# Patient Record
Sex: Female | Born: 1985 | Hispanic: Yes | Marital: Married | Smoking: Never smoker
Health system: Southern US, Community
[De-identification: ages and names within clinical notes are randomized; demographics above are authoritative.]

## PROBLEM LIST (undated history)

## (undated) DIAGNOSIS — F32A Depression, unspecified: Secondary | ICD-10-CM

## (undated) DIAGNOSIS — I959 Hypotension, unspecified: Secondary | ICD-10-CM

## (undated) DIAGNOSIS — N200 Calculus of kidney: Secondary | ICD-10-CM

## (undated) DIAGNOSIS — F419 Anxiety disorder, unspecified: Secondary | ICD-10-CM

## (undated) DIAGNOSIS — K8020 Calculus of gallbladder without cholecystitis without obstruction: Secondary | ICD-10-CM

## (undated) DIAGNOSIS — G43909 Migraine, unspecified, not intractable, without status migrainosus: Secondary | ICD-10-CM

## (undated) HISTORY — DX: Calculus of kidney: N20.0

## (undated) HISTORY — DX: Migraine, unspecified, not intractable, without status migrainosus: G43.909

---

## 2018-06-24 ENCOUNTER — Emergency Department (HOSPITAL_COMMUNITY)
Admission: EM | Admit: 2018-06-24 | Discharge: 2018-06-24 | Disposition: A | Discharge status: Home / Self Care | Payer: Self-pay | Attending: Emergency Medicine | Admitting: Emergency Medicine

## 2018-06-24 ENCOUNTER — Emergency Department (HOSPITAL_COMMUNITY): Payer: Self-pay

## 2018-06-24 ENCOUNTER — Encounter (HOSPITAL_COMMUNITY): Payer: Self-pay | Admitting: Emergency Medicine

## 2018-06-24 ENCOUNTER — Other Ambulatory Visit: Payer: Self-pay

## 2018-06-24 DIAGNOSIS — R102 Pelvic and perineal pain: Secondary | ICD-10-CM | POA: Insufficient documentation

## 2018-06-24 DIAGNOSIS — R1010 Upper abdominal pain, unspecified: Secondary | ICD-10-CM | POA: Insufficient documentation

## 2018-06-24 DIAGNOSIS — N201 Calculus of ureter: Secondary | ICD-10-CM | POA: Insufficient documentation

## 2018-06-24 HISTORY — DX: Calculus of gallbladder without cholecystitis without obstruction: K80.20

## 2018-06-24 LAB — CBC
HCT: 36.1 % (ref 36.0–46.0)
Hemoglobin: 12.2 g/dL (ref 12.0–15.0)
MCH: 31.7 pg (ref 26.0–34.0)
MCHC: 33.8 g/dL (ref 30.0–36.0)
MCV: 93.8 fL (ref 80.0–100.0)
Platelets: 183 10*3/uL (ref 150–400)
RBC: 3.85 MIL/uL — ABNORMAL LOW (ref 3.87–5.11)
RDW: 12 % (ref 11.5–15.5)
WBC: 5.7 10*3/uL (ref 4.0–10.5)
nRBC: 0 % (ref 0.0–0.2)

## 2018-06-24 LAB — URINALYSIS, ROUTINE W REFLEX MICROSCOPIC
Bilirubin Urine: NEGATIVE
Glucose, UA: NEGATIVE mg/dL
Ketones, ur: 5 mg/dL — AB
Leukocytes,Ua: NEGATIVE
Nitrite: NEGATIVE
Protein, ur: NEGATIVE mg/dL
RBC / HPF: >50 RBC/hpf — ABNORMAL HIGH (ref 0–5)
Specific Gravity, Urine: 1.008 (ref 1.005–1.030)
pH: 6 (ref 5.0–8.0)

## 2018-06-24 LAB — I-STAT BETA HCG BLOOD, ED (MC, WL, AP ONLY): I-stat hCG, quantitative: <5 m[IU]/mL (ref <5)

## 2018-06-24 LAB — COMPREHENSIVE METABOLIC PANEL
ALT: 24 U/L (ref 0–44)
AST: 23 U/L (ref 15–41)
Albumin: 3.9 g/dL (ref 3.5–5.0)
Alkaline Phosphatase: 70 U/L (ref 38–126)
Anion gap: 8 (ref 5–15)
BUN: 12 mg/dL (ref 6–20)
CO2: 21 mmol/L — ABNORMAL LOW (ref 22–32)
Calcium: 8.6 mg/dL — ABNORMAL LOW (ref 8.9–10.3)
Chloride: 107 mmol/L (ref 98–111)
Creatinine, Ser: 0.79 mg/dL (ref 0.44–1.00)
GFR calc Af Amer: >60 mL/min (ref >60)
GFR calc non Af Amer: >60 mL/min (ref >60)
Glucose, Bld: 90 mg/dL (ref 70–99)
Potassium: 4 mmol/L (ref 3.5–5.1)
Sodium: 136 mmol/L (ref 135–145)
Total Bilirubin: 0.6 mg/dL (ref 0.3–1.2)
Total Protein: 7.1 g/dL (ref 6.5–8.1)

## 2018-06-24 LAB — LIPASE, BLOOD: Lipase: 35 U/L (ref 11–51)

## 2018-06-24 IMAGING — CT CT RENAL STONE PROTOCOL
2 of 4 series · 16 of 46 positions shown, 18 images · non-contrast
Comparison: None.

CLINICAL DATA: Onset abdominal pain last night. Pain is not
centered in the right lower quadrant and is worsened.

EXAM:
CT ABDOMEN AND PELVIS WITHOUT CONTRAST
TECHNIQUE: Multidetector CT imaging of the abdomen and pelvis was performed
following the standard protocol without IV contrast.

[Series 2: axial st · axial · 0.76mm/px · z∈[-501,-116]mm · 13 of 89 slices shown, 15 images]
[im 6/89  soft-tissue]
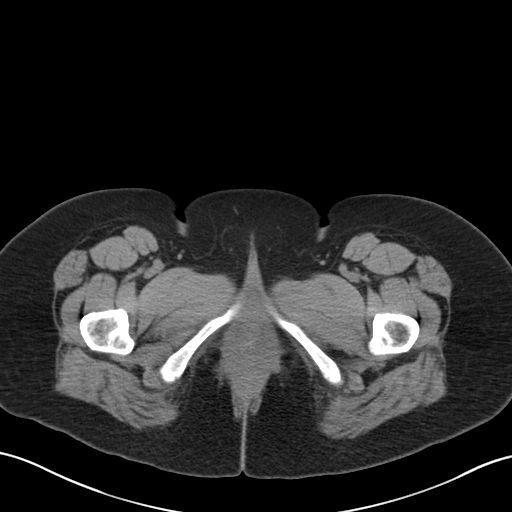
[im 6/89  bone]
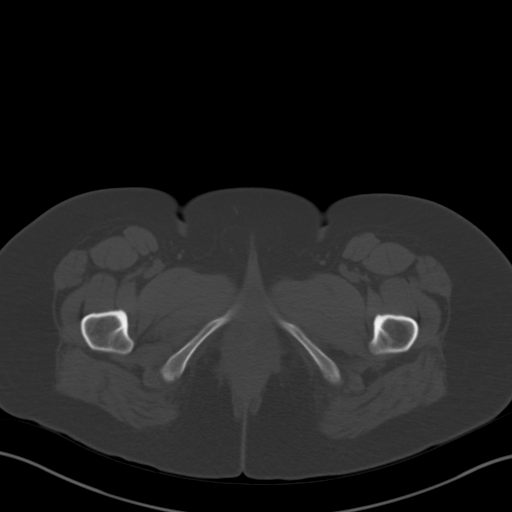
[im 12/89  soft-tissue]
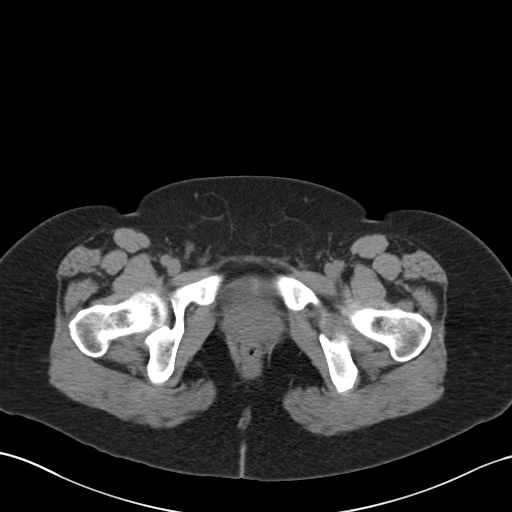
[im 17/89  soft-tissue]
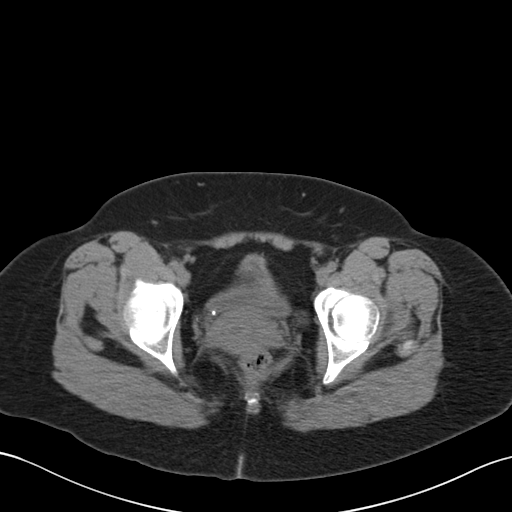
[im 28/89  soft-tissue]
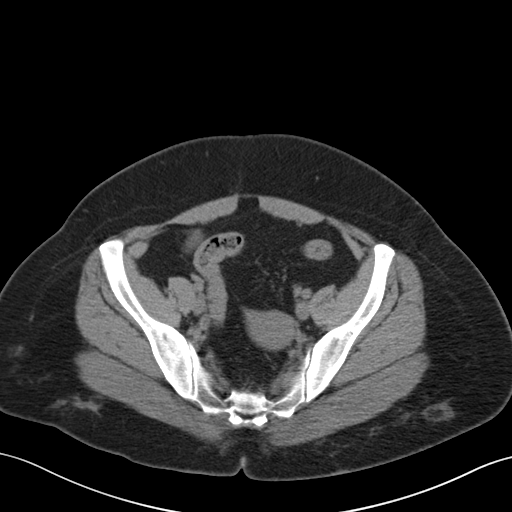
[im 34/89  soft-tissue]
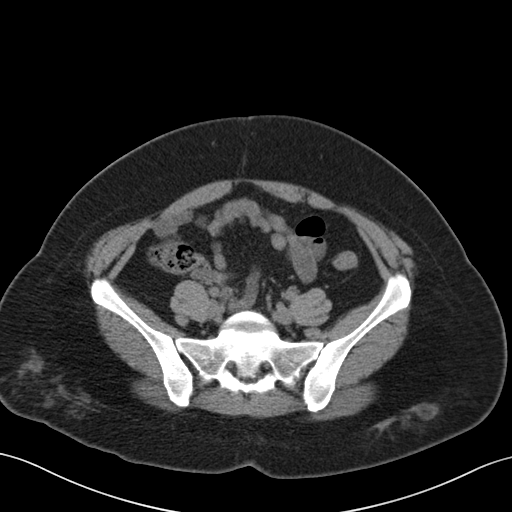
[im 39/89  soft-tissue]
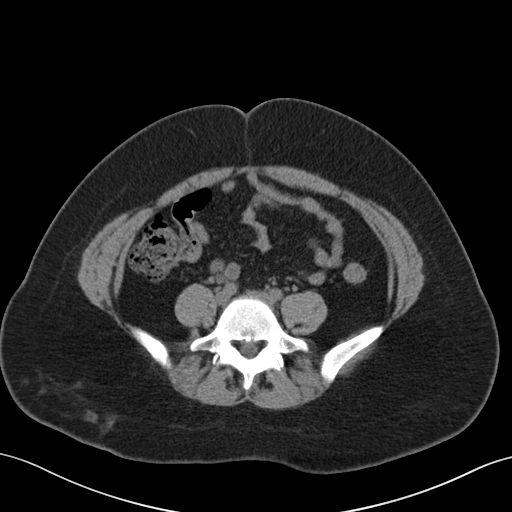
[im 45/89  soft-tissue]
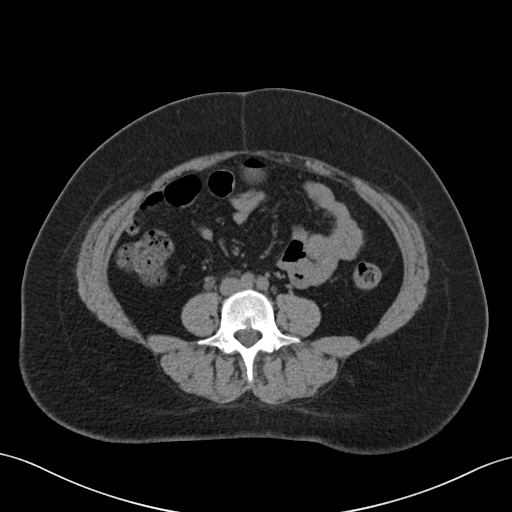
[im 50/89  soft-tissue]
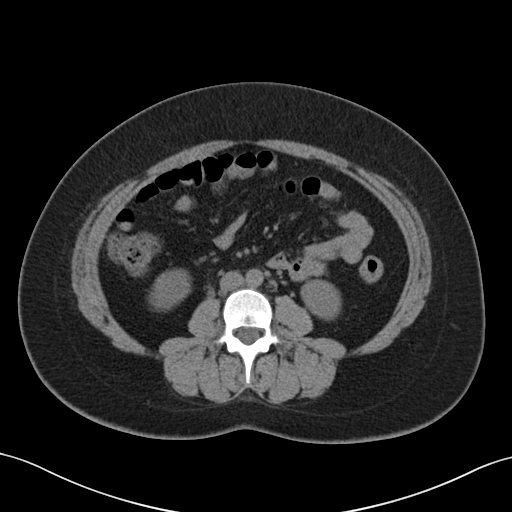
[im 56/89  soft-tissue]
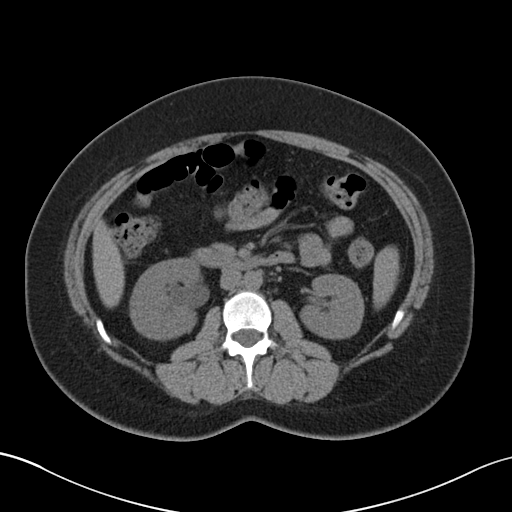
[im 56/89  bone]
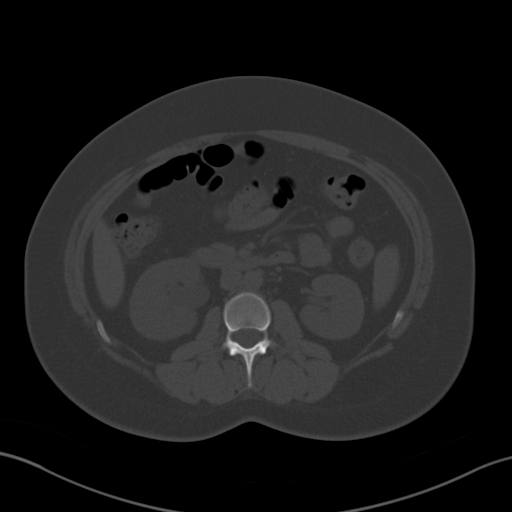
[im 61/89  soft-tissue]
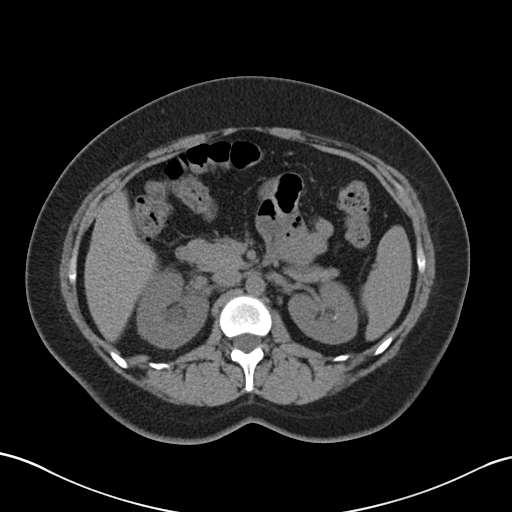
[im 72/89  soft-tissue]
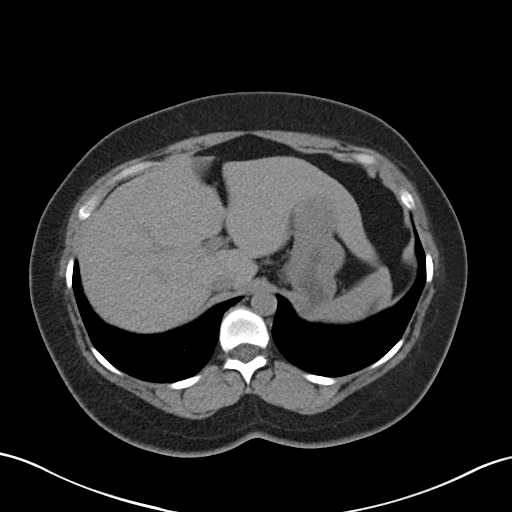
[im 78/89  soft-tissue]
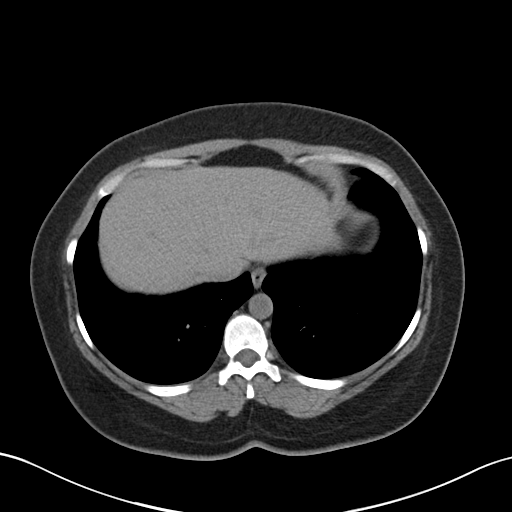
[im 83/89  soft-tissue]
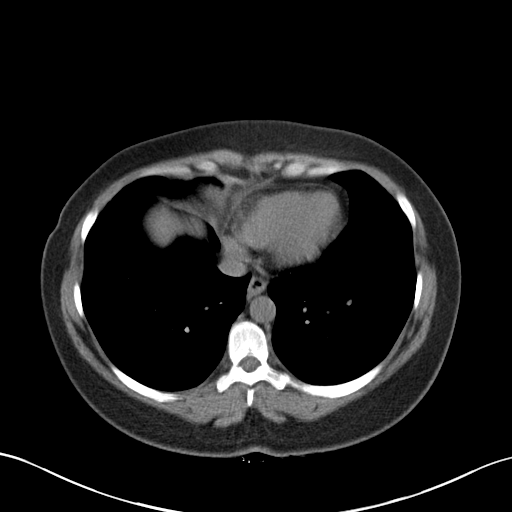

[Series 4: coronal · coronal · 0.70mm/px · 3 of 142 slices shown]
[im 48/142  soft-tissue]
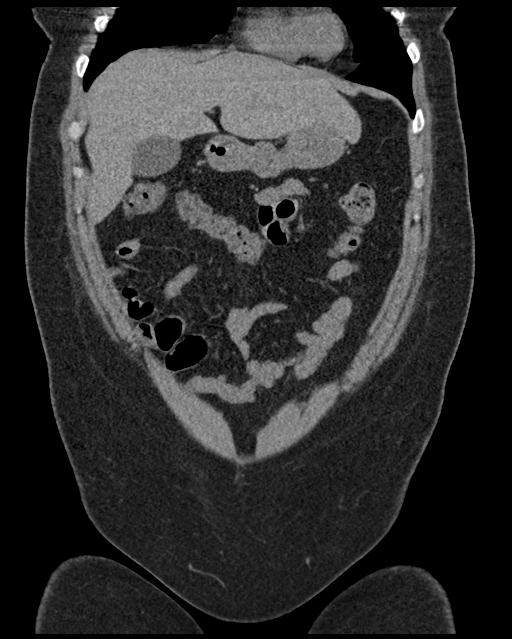
[im 63/142  soft-tissue]
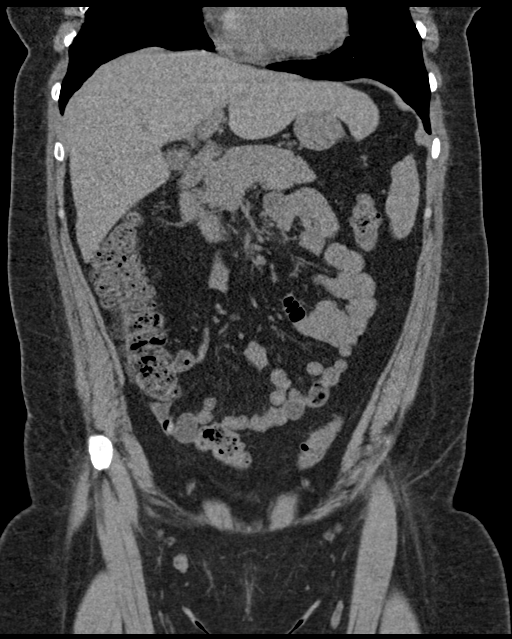
[im 79/142  soft-tissue]
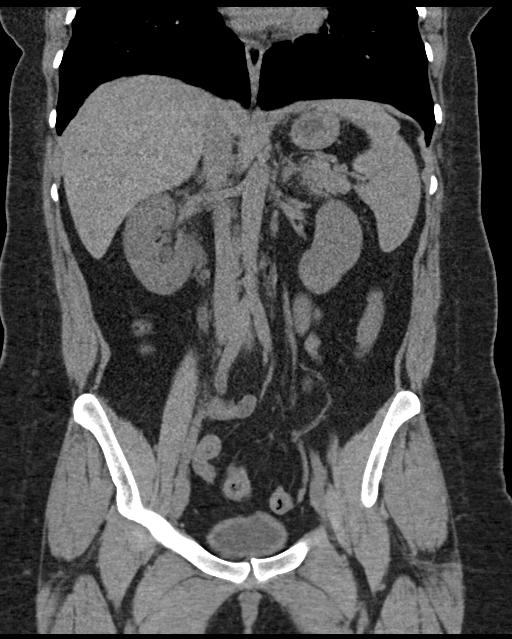

[16 of 46 positions shown; findings below may reference images not displayed]

FINDINGS: Lower chest: Lung bases are clear. No pleural or pericardial
effusion. Heart size is normal.

Hepatobiliary: No focal liver abnormality is seen. No gallstones,
gallbladder wall thickening, or biliary dilatation.

Pancreas: Unremarkable. No pancreatic ductal dilatation or
surrounding inflammatory changes.

Spleen: Normal in size without focal abnormality.

Adrenals/Urinary Tract: The patient has mild to moderate right
hydronephrosis with some stranding about the right kidney and ureter
due to a 0.3 cm stone at the right UVJ. 0.3 cm nonobstructing stone
upper pole left kidney is identified. The kidneys are otherwise
normal appearance. Urinary bladder appears normal. The adrenal
glands are normal.

Stomach/Bowel: Stomach is within normal limits. Appendix appears
normal. No evidence of bowel wall thickening, distention, or
inflammatory changes.

Vascular/Lymphatic: No significant vascular findings are present. No
enlarged abdominal or pelvic lymph nodes.

Reproductive: Uterus and bilateral adnexa are unremarkable.

Other: None.

Musculoskeletal: Negative.
IMPRESSION: Mild to moderate right hydronephrosis due to a 0.3 cm stone at the
right UVJ.

0.3 cm nonobstructing stone left kidney.

## 2018-06-24 IMAGING — US ULTRASOUND ABDOMEN LIMITED
1 series · 14 of 25 positions shown · non-contrast
Comparison: None.

CLINICAL DATA: Upper abdominal pain and nausea.

EXAM:
ULTRASOUND ABDOMEN LIMITED RIGHT UPPER QUADRANT

[Series 1: ultrasound abdomen limited · 14 of 33 slices shown]
[im 1/33]
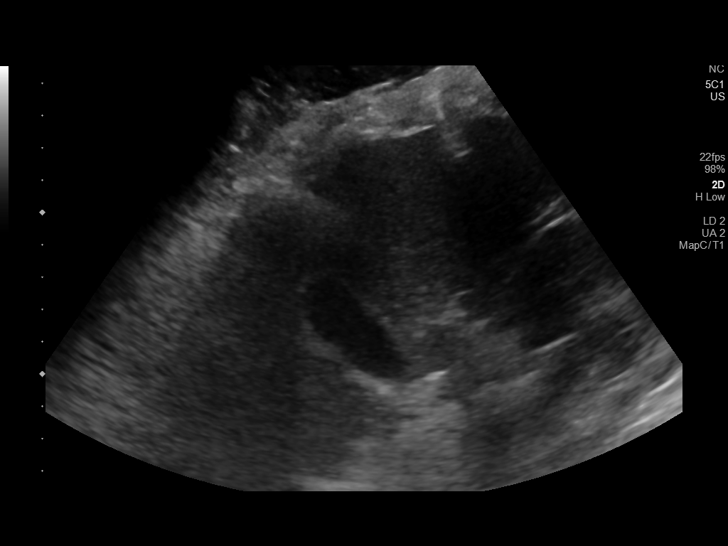
[im 3/33]
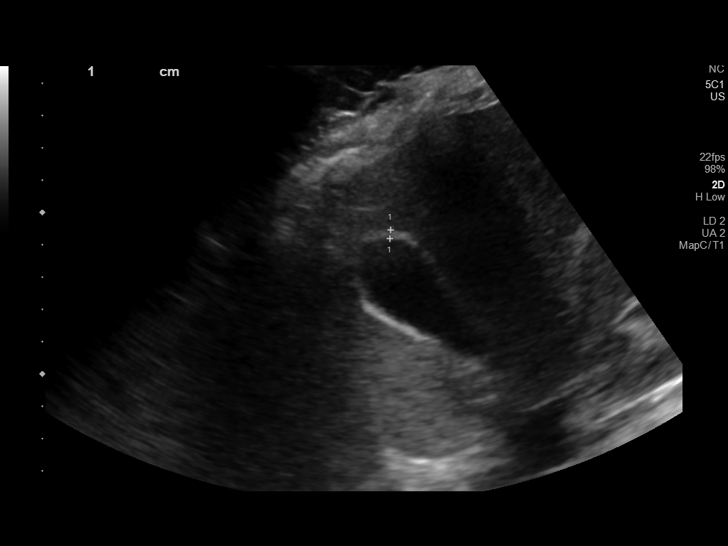
[im 6/33]
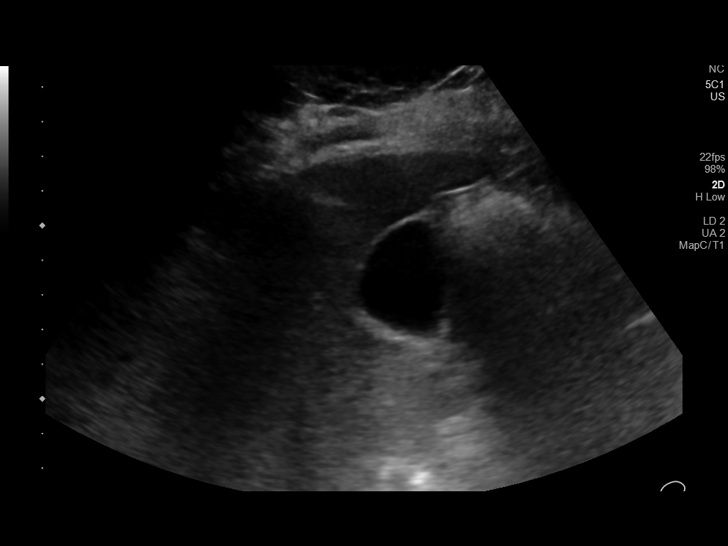
[im 9/33]
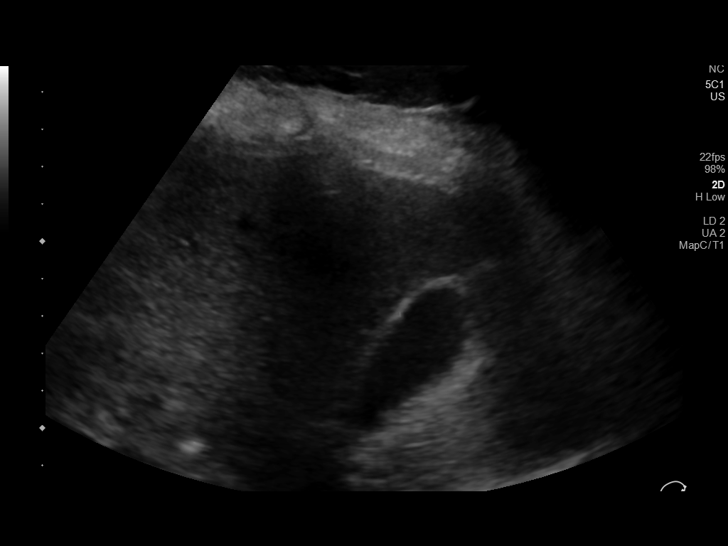
[im 11/33]
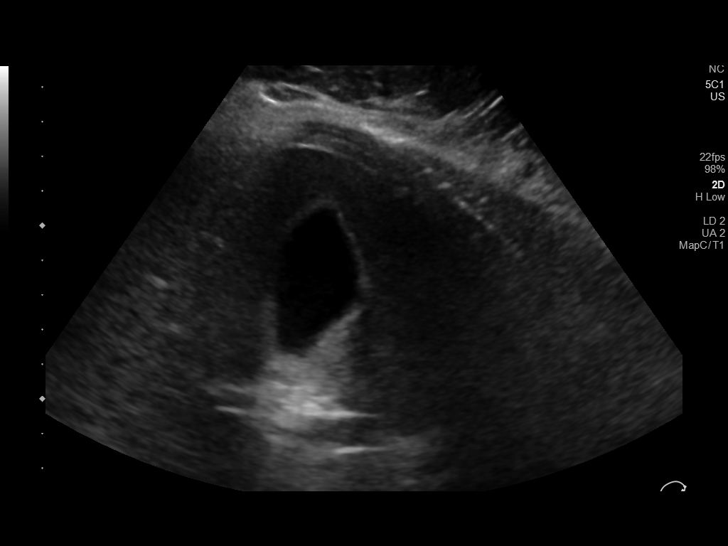
[im 13/33]
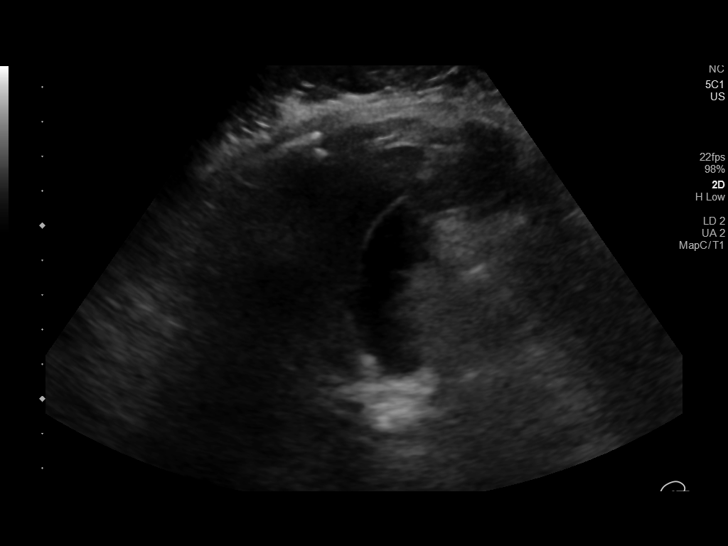
[im 15/33]
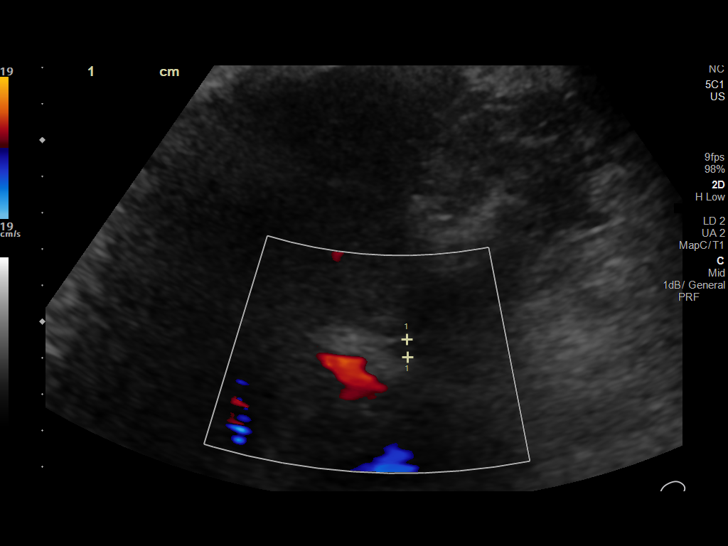
[im 18/33]
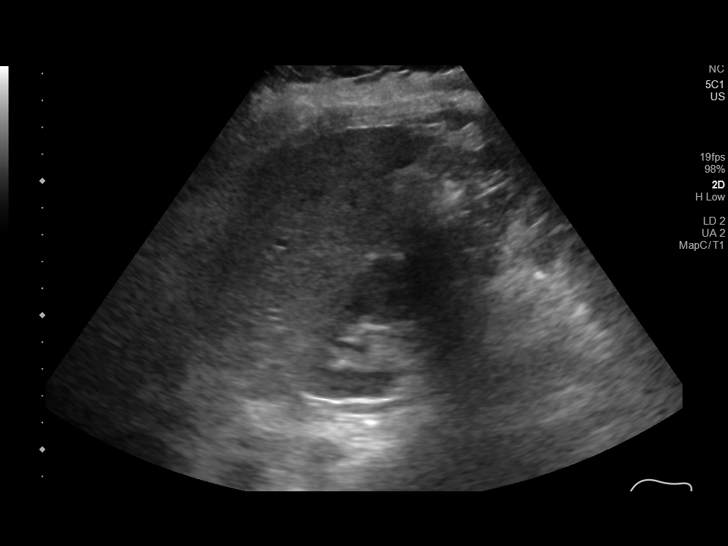
[im 21/33]
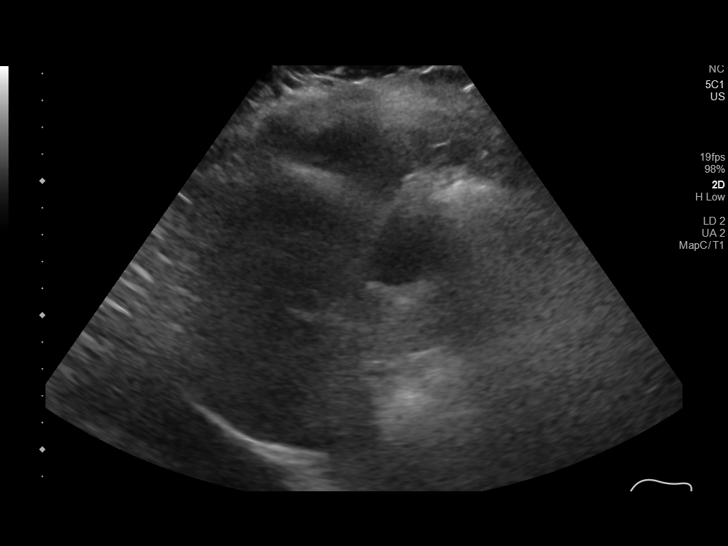
[im 22/33]
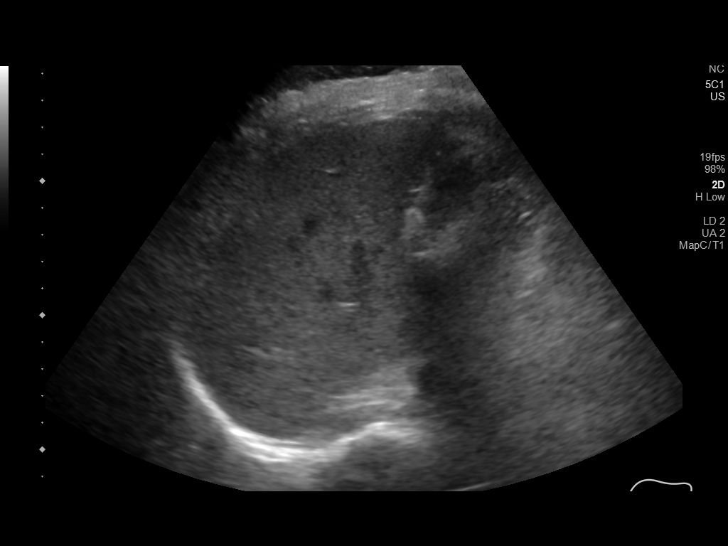
[im 25/33]
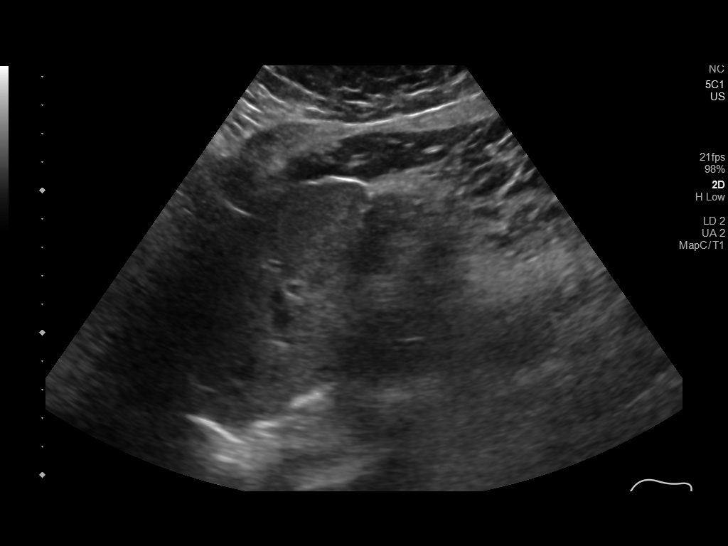
[im 27/33]
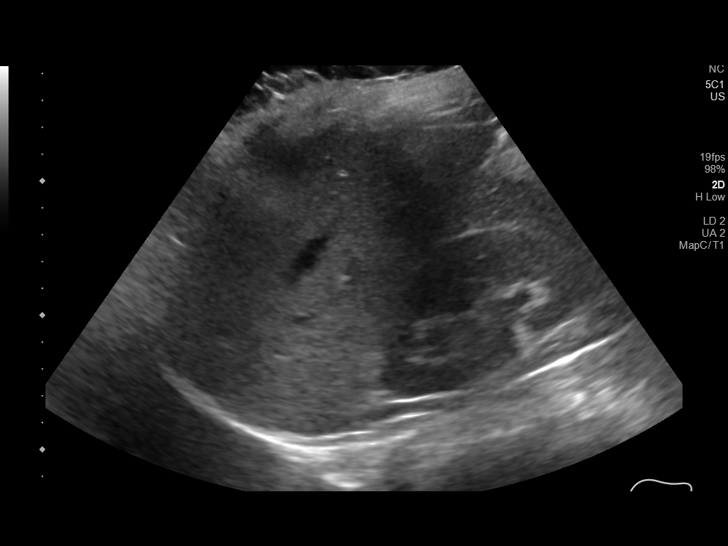
[im 30/33]
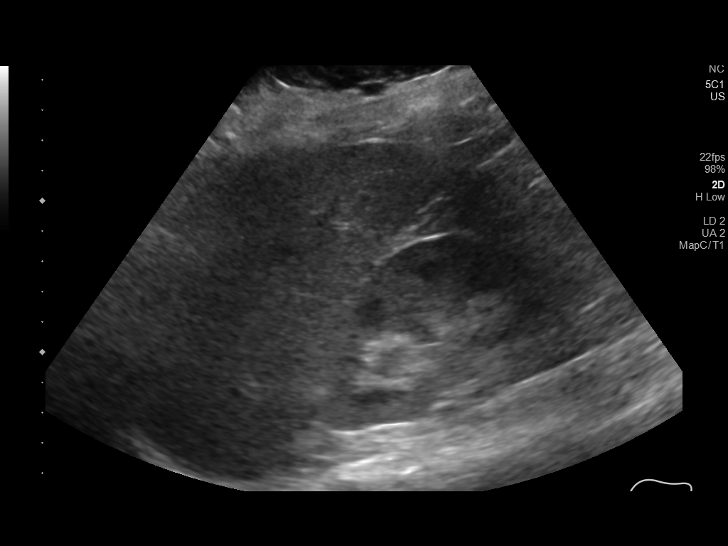
[im 33/33]
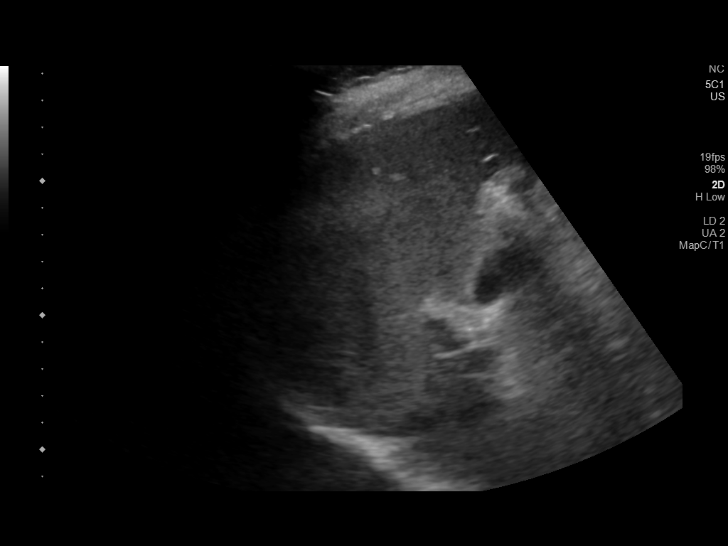

[14 of 25 positions shown; findings below may reference images not displayed]

FINDINGS: Gallbladder:

No gallstones or wall thickening visualized. No sonographic Murphy
sign noted by sonographer.

Common bile duct:

Diameter: 4.9 mm, normal.

Liver:

No focal lesion identified. Within normal limits in parenchymal
echogenicity. Portal vein is patent on color Doppler imaging with
normal direction of blood flow towards the liver.
IMPRESSION: Normal exam.

## 2018-06-24 MED ORDER — IBUPROFEN 600 MG PO TABS: 600.0000 mg | ORAL_TABLET | Freq: Three times a day (TID) | ORAL | 0 refills | Status: DC | PRN

## 2018-06-24 MED ORDER — SODIUM CHLORIDE 0.9 % IV BOLUS
1000.0000 mL | Freq: Once | INTRAVENOUS | Status: AC
  Administered 2018-06-24: 1000 mL via INTRAVENOUS

## 2018-06-24 MED ORDER — TAMSULOSIN HCL 0.4 MG PO CAPS: 0.4000 mg | ORAL_CAPSULE | Freq: Every day | ORAL | 0 refills | Status: DC

## 2018-06-24 MED ORDER — ONDANSETRON HCL 4 MG/2ML IJ SOLN
4.0000 mg | Freq: Once | INTRAMUSCULAR | Status: AC
  Administered 2018-06-24: 08:00:00 4 mg via INTRAVENOUS
  Filled 2018-06-24: qty 2

## 2018-06-24 MED ORDER — OXYCODONE-ACETAMINOPHEN 5-325 MG PO TABS: 1.0000 | ORAL_TABLET | ORAL | 0 refills | Status: DC | PRN

## 2018-06-24 MED ORDER — MORPHINE SULFATE (PF) 4 MG/ML IV SOLN
4.0000 mg | Freq: Once | INTRAVENOUS | Status: AC
  Administered 2018-06-24: 08:00:00 4 mg via INTRAVENOUS
  Filled 2018-06-24: qty 1

## 2018-06-24 MED ORDER — KETOROLAC TROMETHAMINE 30 MG/ML IJ SOLN
30.0000 mg | Freq: Once | INTRAMUSCULAR | Status: AC
  Administered 2018-06-24: 11:00:00 30 mg via INTRAVENOUS
  Filled 2018-06-24: qty 1

## 2018-06-24 MED ORDER — PROMETHAZINE HCL 25 MG PO TABS: 25.0000 mg | ORAL_TABLET | Freq: Four times a day (QID) | ORAL | 0 refills | Status: DC | PRN

## 2018-06-24 MED ORDER — MORPHINE SULFATE (PF) 4 MG/ML IV SOLN
4.0000 mg | Freq: Once | INTRAVENOUS | Status: AC
  Administered 2018-06-24: 4 mg via INTRAVENOUS
  Filled 2018-06-24: qty 1

## 2018-06-24 MED ORDER — OXYCODONE-ACETAMINOPHEN 5-325 MG PO TABS
2.0000 | ORAL_TABLET | Freq: Once | ORAL | Status: AC
  Administered 2018-06-24: 12:00:00 2 via ORAL
  Filled 2018-06-24: qty 2

## 2018-06-24 NOTE — ED Provider Notes (Signed)
[facility]-EMERGENCY DEPT Provider Note   CSN: [id number] Arrival date & time: 06/24/18  4098    History   Chief Complaint Chief Complaint  Patient presents with  . Abdominal Pain   Interpretor services utilized HPI Kimberly Poole is a 33 y.o. female.     HPI 33 year old female presents the emergency department with severe right upper quadrant abdominal pain that began last night.  It is been worsening this morning.  She reports nausea without vomiting or diarrhea.  She denies fevers or chills.  No discomfort with urination.  She reports a history of gallstones.  No recent sick contacts.  No fevers.  Pain is moderate to severe in severity.   Past Medical History:  Diagnosis Date  . Gallstones     There are no active problems to display for this patient.   History reviewed. No pertinent surgical history.   OB History   No obstetric history on file.      Home Medications    Prior to Admission medications   Not on File    Family History History reviewed. No pertinent family history.  Social History Social History   Tobacco Use  . Smoking status: Not on file  Substance Use Topics  . Alcohol use: Not on file  . Drug use: Not on file     Allergies   Patient has no allergy information on record.   Review of Systems Review of Systems  All other systems reviewed and are negative.    Physical Exam Updated Vital Signs BP 115/62 (BP Location: Right Arm)   Pulse 93   Temp 97.8 F (36.6 C) (Oral)   Resp 20   Ht  (1.626 m)   Wt 75 kg   LMP  (LMP Unknown)   SpO2 100%   BMI 28.38 kg/m   Physical Exam Vitals signs and nursing note reviewed.  Constitutional:      General: She is not in acute distress.    Appearance: She is well-developed.  HENT:     Head: Normocephalic and atraumatic.  Neck:     Musculoskeletal: Normal range of motion.  Cardiovascular:     Rate and Rhythm: Normal rate and regular rhythm.     Heart  sounds: Normal heart sounds.  Pulmonary:     Effort: Pulmonary effort is normal.     Breath sounds: Normal breath sounds.  Abdominal:     General: There is no distension.     Palpations: Abdomen is soft.     Tenderness: There is abdominal tenderness in the right upper quadrant and epigastric area.  Musculoskeletal: Normal range of motion.  Skin:    General: Skin is warm and dry.  Neurological:     Mental Status: She is alert and oriented to person, place, and time.  Psychiatric:        Judgment: Judgment normal.      ED Treatments / Results  Labs (all labs ordered are listed, but only abnormal results are displayed) Labs Reviewed  CBC - Abnormal; Notable for the following components:      Result Value   RBC 3.85 (*)    All other components within normal limits  COMPREHENSIVE METABOLIC PANEL - Abnormal; Notable for the following components:   CO2 21 (*)    Calcium 8.6 (*)    All other components within normal limits  URINALYSIS, ROUTINE W REFLEX MICROSCOPIC - Abnormal; Notable for the following components:   APPearance HAZY (*)  Hgb urine dipstick LARGE (*)    Ketones, ur 5 (*)    RBC / HPF >50 (*)    Bacteria, UA MANY (*)    All other components within normal limits  LIPASE, BLOOD  I-STAT BETA HCG BLOOD, ED ([facility], [facility], [facility] ONLY)    EKG None  Radiology Ct Renal Stone Study  Result Date: 06/24/2018 CLINICAL DATA:  Onset abdominal pain last night. Pain is not centered in the right lower quadrant and is worsened. EXAM: CT ABDOMEN AND PELVIS WITHOUT CONTRAST TECHNIQUE: Multidetector CT imaging of the abdomen and pelvis was performed following the standard protocol without IV contrast. COMPARISON:  None. FINDINGS: Lower chest: Lung bases are clear. No pleural or pericardial effusion. Heart size is normal. Hepatobiliary: No focal liver abnormality is seen. No gallstones, gallbladder wall thickening, or biliary dilatation. Pancreas: Unremarkable. No pancreatic ductal dilatation  or surrounding inflammatory changes. Spleen: Normal in size without focal abnormality. Adrenals/Urinary Tract: The patient has mild to moderate right hydronephrosis with some stranding about the right kidney and ureter due to a 0.3 cm stone at the right UVJ. 0.3 cm nonobstructing stone upper pole left kidney is identified. The kidneys are otherwise normal appearance. Urinary bladder appears normal. The adrenal glands are normal. Stomach/Bowel: Stomach is within normal limits. Appendix appears normal. No evidence of bowel wall thickening, distention, or inflammatory changes. Vascular/Lymphatic: No significant vascular findings are present. No enlarged abdominal or pelvic lymph nodes. Reproductive: Uterus and bilateral adnexa are unremarkable. Other: None. Musculoskeletal: Negative. IMPRESSION: Mild to moderate right hydronephrosis due to a 0.3 cm stone at the right UVJ. 0.3 cm nonobstructing stone left kidney. Electronically Signed   By: Drusilla Kanner M.D.   On: 06/24/2018 10:42   US Abdomen Limited Ruq  Result Date: 06/24/2018 CLINICAL DATA:  Upper abdominal pain and nausea. EXAM: ULTRASOUND ABDOMEN LIMITED RIGHT UPPER QUADRANT COMPARISON:  None. FINDINGS: Gallbladder: No gallstones or wall thickening visualized. No sonographic Murphy sign noted by sonographer. Common bile duct: Diameter: 4.9 mm, normal. Liver: No focal lesion identified. Within normal limits in parenchymal echogenicity. Portal vein is patent on color Doppler imaging with normal direction of blood flow towards the liver. IMPRESSION: Normal exam. Electronically Signed   By: Francene Boyers M.D.   On: 06/24/2018 08:38    Procedures Procedures (including critical care time)  Medications Ordered in ED Medications  oxyCODONE-acetaminophen (PERCOCET/ROXICET) 5-325 MG per tablet 2 tablet (has no administration in time range)  morphine 4 MG/ML injection 4 mg (4 mg Intravenous Given 06/24/18 0801)  ondansetron (ZOFRAN) injection 4 mg (4 mg  Intravenous Given 06/24/18 0801)  sodium chloride 0.9 % bolus 1,000 mL (0 mLs Intravenous Stopped 06/24/18 0922)  morphine 4 MG/ML injection 4 mg (4 mg Intravenous Given 06/24/18 0814)  ketorolac (TORADOL) 30 MG/ML injection 30 mg (30 mg Intravenous Given 06/24/18 1104)     Initial Impression / Assessment and Plan / ED Course  I have reviewed the triage vital signs and the nursing notes.  Pertinent labs & imaging results that were available during my care of the patient were reviewed by me and considered in my medical decision making (see chart for details).        Right distal ureteral stone. Standard precautions given. Pain significantly improved in the ER. Discharge home with standard medications. Outpatient urology follow up. No signs of UTI.     Final Clinical Impressions(s) / ED Diagnoses   Final diagnoses:  Upper abdominal pain    ED Discharge Orders  None       Azalia Bilis, MD 06/24/18 1134

## 2018-06-24 NOTE — ED Notes (Signed)
Bed: OZ30 Expected date:  Expected time:  Means of arrival:  Comments: 33 yo F/ abd pain

## 2018-06-24 NOTE — ED Notes (Signed)
Patient verbalized understanding using Wali translation device. Patient was taken out of ED w/ wheelchair by ED staff.

## 2018-06-24 NOTE — ED Notes (Addendum)
[place] is at bedside doing ABD ultrasound.

## 2018-06-24 NOTE — ED Triage Notes (Signed)
Patient arrived by EMS from home. Pt c/o ABD pain that started last night. Pt woke up 0700 w/ unbearable pain on the lower right quadrant that radiates towards the back per EMS. EMS reported no V/D. Pt has a little nausea.   Pt only speaks [place]. PT is new to area. PT came from [place] about two months ago per EMS.   Hx of gallstones.   EMS VS BP 118/86, HR 78, RR 18, SpO2 99% on RA, T 97.5 F.

## 2018-06-24 NOTE — ED Notes (Signed)
Patient made aware of need for urine sample. Patient stated they are unable to provide at this time.  

## 2018-06-24 NOTE — ED Notes (Addendum)
PT aware of urine sample needed. PT unable to provide at this time.

## 2018-06-24 NOTE — ED Notes (Signed)
Patient states she has pain in RLQ.

## 2018-06-24 NOTE — ED Notes (Addendum)
Wali translation device was used for triage questions and assessment questions. Patient speaks Spanish.

## 2018-07-13 ENCOUNTER — Emergency Department (HOSPITAL_COMMUNITY)
Admission: EM | Admit: 2018-07-13 | Discharge: 2018-07-13 | Disposition: A | Discharge status: Home / Self Care | Payer: Self-pay | Attending: Emergency Medicine | Admitting: Emergency Medicine

## 2018-07-13 ENCOUNTER — Other Ambulatory Visit: Payer: Self-pay

## 2018-07-13 DIAGNOSIS — N23 Unspecified renal colic: Secondary | ICD-10-CM | POA: Insufficient documentation

## 2018-07-13 LAB — URINALYSIS, ROUTINE W REFLEX MICROSCOPIC
Bilirubin Urine: NEGATIVE
Glucose, UA: NEGATIVE mg/dL
Ketones, ur: 5 mg/dL — AB
Leukocytes,Ua: NEGATIVE
Nitrite: NEGATIVE
Protein, ur: NEGATIVE mg/dL
Specific Gravity, Urine: 1.006 (ref 1.005–1.030)
pH: 7 (ref 5.0–8.0)

## 2018-07-13 LAB — CBC WITH DIFFERENTIAL/PLATELET
Abs Immature Granulocytes: 0.03 10*3/uL (ref 0.00–0.07)
Basophils Absolute: 0 10*3/uL (ref 0.0–0.1)
Basophils Relative: 1 %
Eosinophils Absolute: 0 10*3/uL (ref 0.0–0.5)
Eosinophils Relative: 0 %
HCT: 37.8 % (ref 36.0–46.0)
Hemoglobin: 12.6 g/dL (ref 12.0–15.0)
Immature Granulocytes: 1 %
Lymphocytes Relative: 22 %
Lymphs Abs: 1.4 10*3/uL (ref 0.7–4.0)
MCH: 31.3 pg (ref 26.0–34.0)
MCHC: 33.3 g/dL (ref 30.0–36.0)
MCV: 94 fL (ref 80.0–100.0)
Monocytes Absolute: 0.6 10*3/uL (ref 0.1–1.0)
Monocytes Relative: 9 %
Neutro Abs: 4.5 10*3/uL (ref 1.7–7.7)
Neutrophils Relative %: 67 %
Platelets: 216 10*3/uL (ref 150–400)
RBC: 4.02 MIL/uL (ref 3.87–5.11)
RDW: 11.9 % (ref 11.5–15.5)
WBC: 6.6 10*3/uL (ref 4.0–10.5)
nRBC: 0 % (ref 0.0–0.2)

## 2018-07-13 LAB — POC URINE PREG, ED: Preg Test, Ur: NEGATIVE

## 2018-07-13 LAB — BASIC METABOLIC PANEL
Anion gap: 9 (ref 5–15)
BUN: 16 mg/dL (ref 6–20)
CO2: 20 mmol/L — ABNORMAL LOW (ref 22–32)
Calcium: 8.8 mg/dL — ABNORMAL LOW (ref 8.9–10.3)
Chloride: 110 mmol/L (ref 98–111)
Creatinine, Ser: 0.86 mg/dL (ref 0.44–1.00)
GFR calc Af Amer: >60 mL/min (ref >60)
GFR calc non Af Amer: >60 mL/min (ref >60)
Glucose, Bld: 112 mg/dL — ABNORMAL HIGH (ref 70–99)
Potassium: 3.4 mmol/L — ABNORMAL LOW (ref 3.5–5.1)
Sodium: 139 mmol/L (ref 135–145)

## 2018-07-13 MED ORDER — HYDROMORPHONE HCL 1 MG/ML IJ SOLN
1.0000 mg | Freq: Once | INTRAMUSCULAR | Status: AC
  Administered 2018-07-13: 02:00:00 1 mg via INTRAVENOUS
  Filled 2018-07-13: qty 1

## 2018-07-13 MED ORDER — LACTATED RINGERS IV BOLUS
1000.0000 mL | Freq: Once | INTRAVENOUS | Status: AC
  Administered 2018-07-13: 02:00:00 1000 mL via INTRAVENOUS

## 2018-07-13 MED ORDER — PROMETHAZINE HCL 25 MG PO TABS: 25.0000 mg | ORAL_TABLET | Freq: Four times a day (QID) | ORAL | 0 refills | Status: DC | PRN

## 2018-07-13 MED ORDER — TAMSULOSIN HCL 0.4 MG PO CAPS: 0.4000 mg | ORAL_CAPSULE | Freq: Every day | ORAL | 0 refills | Status: DC

## 2018-07-13 MED ORDER — ONDANSETRON HCL 4 MG/2ML IJ SOLN
4.0000 mg | Freq: Once | INTRAMUSCULAR | Status: AC
  Administered 2018-07-13: 02:00:00 4 mg via INTRAVENOUS
  Filled 2018-07-13: qty 2

## 2018-07-13 MED ORDER — TAMSULOSIN HCL 0.4 MG PO CAPS
0.4000 mg | ORAL_CAPSULE | Freq: Once | ORAL | Status: AC
  Administered 2018-07-13: 0.4 mg via ORAL
  Filled 2018-07-13: qty 1

## 2018-07-13 MED ORDER — IBUPROFEN 200 MG PO TABS
400.0000 mg | ORAL_TABLET | Freq: Once | ORAL | Status: AC
  Administered 2018-07-13: 05:00:00 400 mg via ORAL
  Filled 2018-07-13: qty 2

## 2018-07-13 MED ORDER — DIPHENHYDRAMINE HCL 50 MG/ML IJ SOLN
INTRAMUSCULAR | Status: AC
  Administered 2018-07-13: 02:00:00 25 mg
  Filled 2018-07-13: qty 1

## 2018-07-13 MED ORDER — OXYCODONE-ACETAMINOPHEN 5-325 MG PO TABS: 1.0000 | ORAL_TABLET | ORAL | 0 refills | Status: DC | PRN

## 2018-07-13 MED ORDER — IBUPROFEN 600 MG PO TABS: 600.0000 mg | ORAL_TABLET | Freq: Three times a day (TID) | ORAL | 0 refills | Status: DC | PRN

## 2018-07-13 NOTE — ED Provider Notes (Signed)
Emergency Department Provider Note   I have reviewed the triage vital signs and the nursing notes.   HISTORY  Chief Complaint Flank Pain   HPI Kimberly Poole is a 33 y.o. female LLQ sharp abdominal pain with some associated flank pain. Mild nausea. No vomiting.    With interpreter patient states that this pain started earlier today and severe in nature mostly left lower quadrant.  Feels very similar to previous kidney stone she had a few weeks ago.  For the time she never had a kidney stone before.  She had some upset stomach with this pain today and difficult to get into a comfortable position.  She did notice some hematuria today as well.  No other dysuria or urinary tract infection symptoms.  No fevers.  No other associated or modifying symptoms.    Past Medical History:  Diagnosis Date  . Gallstones     There are no active problems to display for this patient.   No past surgical history on file.  Current Outpatient Rx  . Order #: [id number] Class: Print  . Order #: [id number] Class: Print  . Order #: [id number] Class: Print  . Order #: [id number] Class: Print    Allergies Diclofenac  No family history on file.  Social History Social History   Tobacco Use  . Smoking status: Not on file  Substance Use Topics  . Alcohol use: Not on file  . Drug use: Not on file    Review of Systems  All other systems negative except as documented in the HPI. All pertinent positives and negatives as reviewed in the HPI. ____________________________________________   PHYSICAL EXAM:  VITAL SIGNS: ED Triage Vitals  Enc Vitals Group     BP 07/13/18 0112 108/82     Pulse Rate 07/13/18 0112 88     Resp 07/13/18 0112 16     Temp 07/13/18 0112 98 F (36.7 C)     Temp Source 07/13/18 0112 Oral     SpO2 07/13/18 0112 100 %    Constitutional: Alert and oriented. Well appearing and in mild distress likely 2/2 pain. Eyes: Conjunctivae are normal. PERRL. EOMI. Head:  Atraumatic. Nose: No congestion/rhinnorhea. Mouth/Throat: Mucous membranes are moist.  Oropharynx non-erythematous. Neck: No stridor.  No meningeal signs.   Cardiovascular: Normal rate, regular rhythm. Good peripheral circulation. Grossly normal heart sounds.   Respiratory: Normal respiratory effort.  No retractions. Lungs CTAB. Gastrointestinal: Soft and ttp in LLQ. No rash. No distention.  Musculoskeletal: No lower extremity tenderness nor edema. No gross deformities of extremities. Neurologic:  Normal speech and language. No gross focal neurologic deficits are appreciated.  Skin:  Skin is warm, dry and intact. No rash noted.   ____________________________________________   LABS (all labs ordered are listed, but only abnormal results are displayed)  Labs Reviewed  BASIC METABOLIC PANEL - Abnormal; Notable for the following components:      Result Value   Potassium 3.4 (*)    CO2 20 (*)    Glucose, Bld 112 (*)    Calcium 8.8 (*)    All other components within normal limits  URINALYSIS, ROUTINE W REFLEX MICROSCOPIC - Abnormal; Notable for the following components:   Hgb urine dipstick LARGE (*)    Ketones, ur 5 (*)    Bacteria, UA RARE (*)    All other components within normal limits  CBC WITH DIFFERENTIAL/PLATELET  POC URINE PREG, ED   ____________________________________________   INITIAL IMPRESSION / ASSESSMENT AND PLAN / ED COURSE  eval for  kidney stone vs diverticulitis vs UTI?  I reviewed the records she did have a 3 mm nonobstructing stone in her left kidney on the CT scan a few weeks ago is likely what this is.  Her pain is controlled nausea is controlled. No e/o AKI/UTI on labs. No indication for imaging. Will discharge on similar medications with urology follow-up.  Pertinent labs & imaging results that were available during my care of the patient were reviewed by me and considered in my medical decision making (see chart for details).  A medical screening exam  was performed and I feel the patient has had an appropriate workup for their chief complaint at this time and likelihood of emergent condition existing is low. They have been counseled on decision, discharge, follow up and which symptoms necessitate immediate return to the emergency department. They or their family verbally stated understanding and agreement with plan and discharged in stable condition.   ____________________________________________  FINAL CLINICAL IMPRESSION(S) / ED DIAGNOSES  Final diagnoses:  Ureteral colic     MEDICATIONS GIVEN DURING THIS VISIT:  Medications  tamsulosin (FLOMAX) capsule 0.4 mg (has no administration in time range)  ibuprofen (ADVIL) tablet 400 mg (has no administration in time range)  HYDROmorphone (DILAUDID) injection 1 mg (1 mg Intravenous Given 07/13/18 0136)  lactated ringers bolus 1,000 mL (0 mLs Intravenous Stopped 07/13/18 0314)  ondansetron (ZOFRAN) injection 4 mg (4 mg Intravenous Given 07/13/18 0136)  diphenhydrAMINE (BENADRYL) 50 MG/ML injection (25 mg  Given 07/13/18 0226)     NEW OUTPATIENT MEDICATIONS STARTED DURING THIS VISIT:  Current Discharge Medication List      Note:  This note was prepared with assistance of Dragon voice recognition software. Occasional wrong-word or sound-a-like substitutions may have occurred due to the inherent limitations of voice recognition software.   Virgel Haro, Barbara CowerJason, MD 07/13/18 [phone]

## 2018-07-13 NOTE — ED Triage Notes (Signed)
Pt c/o left flank pain onset at midnight Hx of same seen at [facility] 06/24/18 by [facility] sent home with flomax phenergan and ibuprophen that is currently ineffective. Given 100 mcg fentanyl per EMS enroute to ED med was ineffective,

## 2018-07-13 NOTE — ED Notes (Signed)
Bed: VZ85 Expected date:  Expected time:  Means of arrival:  Comments: EMS 33 yo female from home-flank pain-hx kidney stone

## 2019-07-28 ENCOUNTER — Ambulatory Visit
Admission: RE | Admit: 2019-07-28 | Discharge: 2019-07-28 | Disposition: A | Discharge status: Home / Self Care | Payer: Self-pay | Source: Ambulatory Visit | Attending: Nurse Practitioner | Admitting: Nurse Practitioner

## 2019-07-28 ENCOUNTER — Other Ambulatory Visit: Payer: Self-pay

## 2019-07-28 ENCOUNTER — Other Ambulatory Visit: Payer: Self-pay | Admitting: Nurse Practitioner

## 2019-07-28 DIAGNOSIS — T148XXA Other injury of unspecified body region, initial encounter: Secondary | ICD-10-CM

## 2019-07-28 IMAGING — CR DG CHEST 2V
2 series · 2 of 2 positions shown · non-contrast
Comparison: None.

CLINICAL DATA: Left anterior upper chest pain for 4 days.

EXAM:
CHEST - 2 VIEW

[w chest pa]
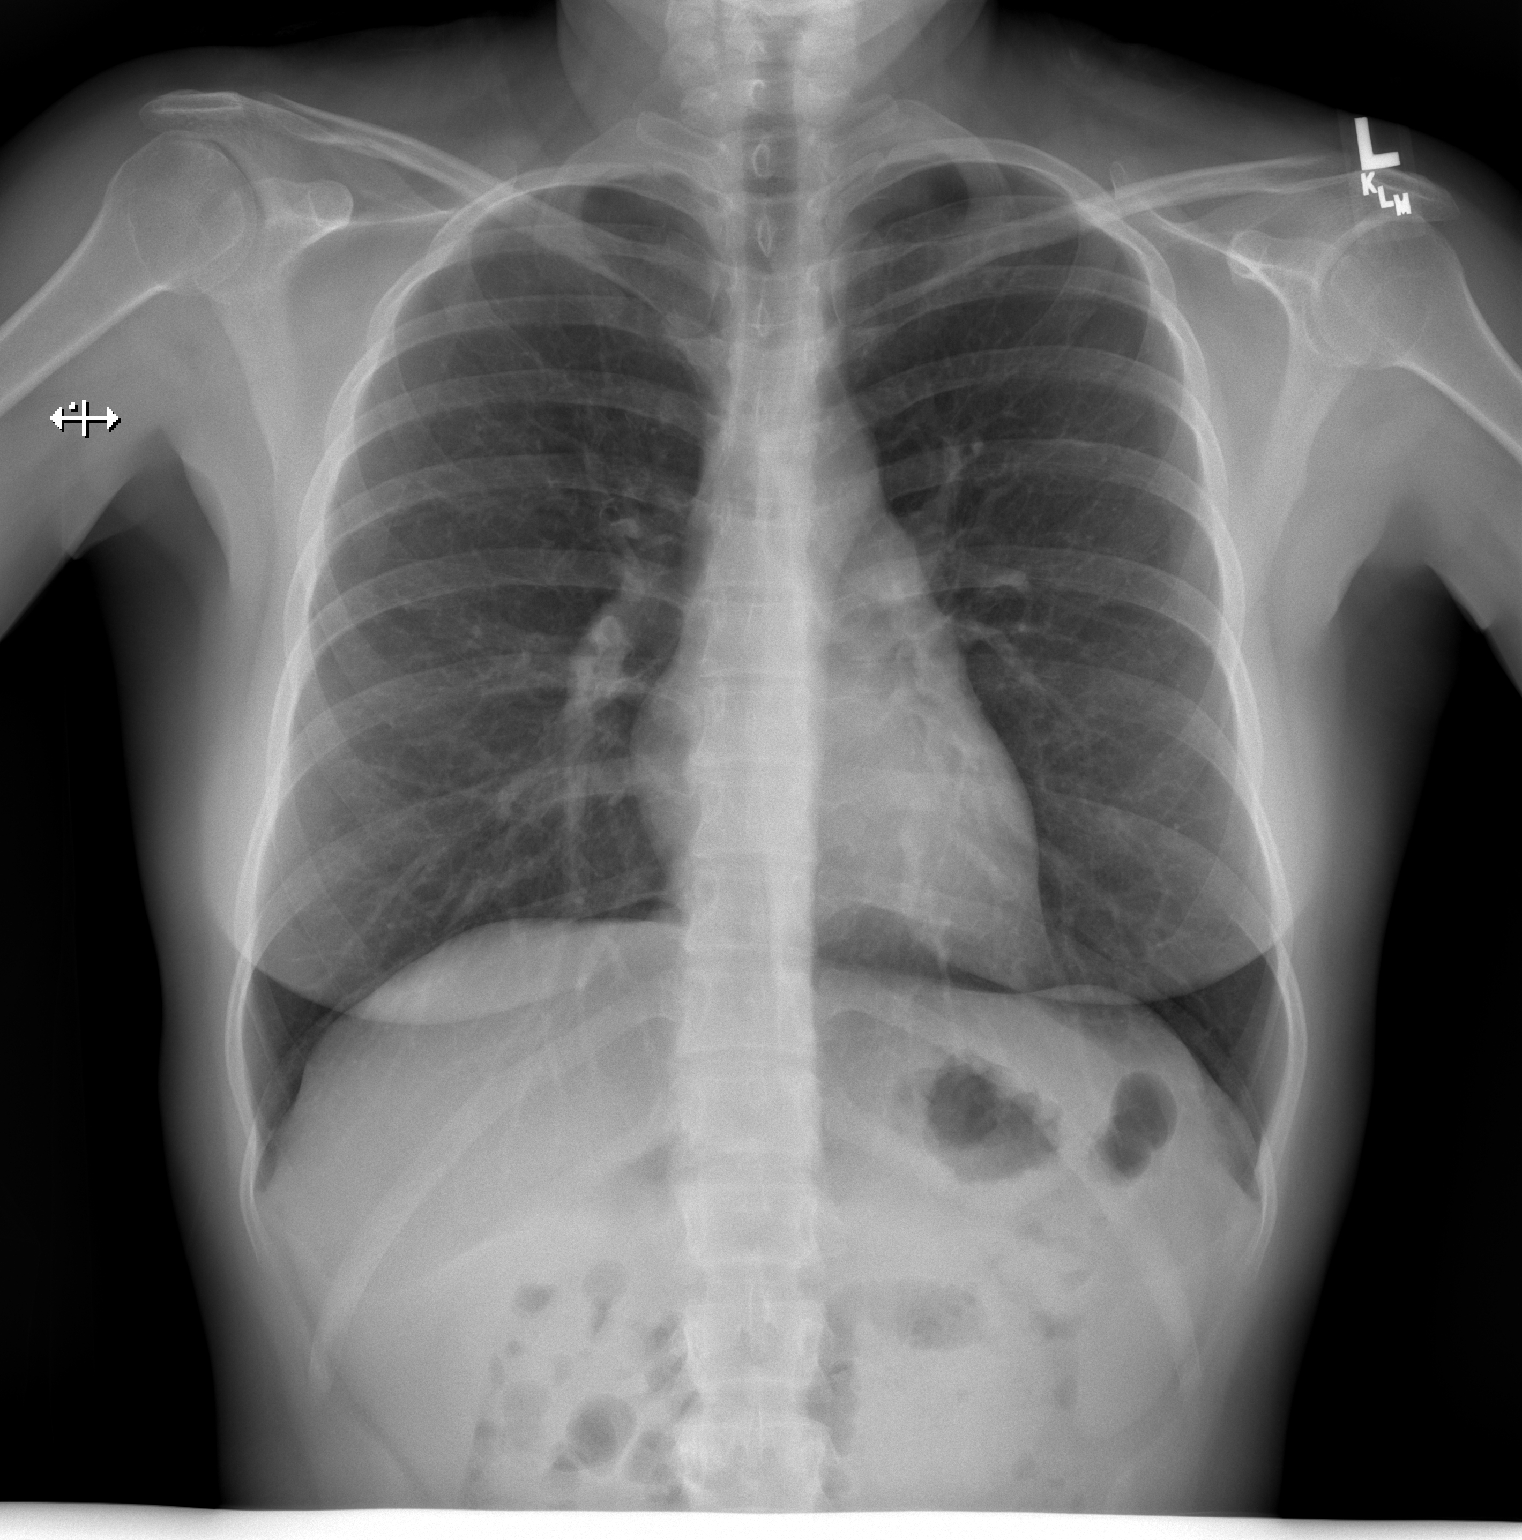

[w chest lat]
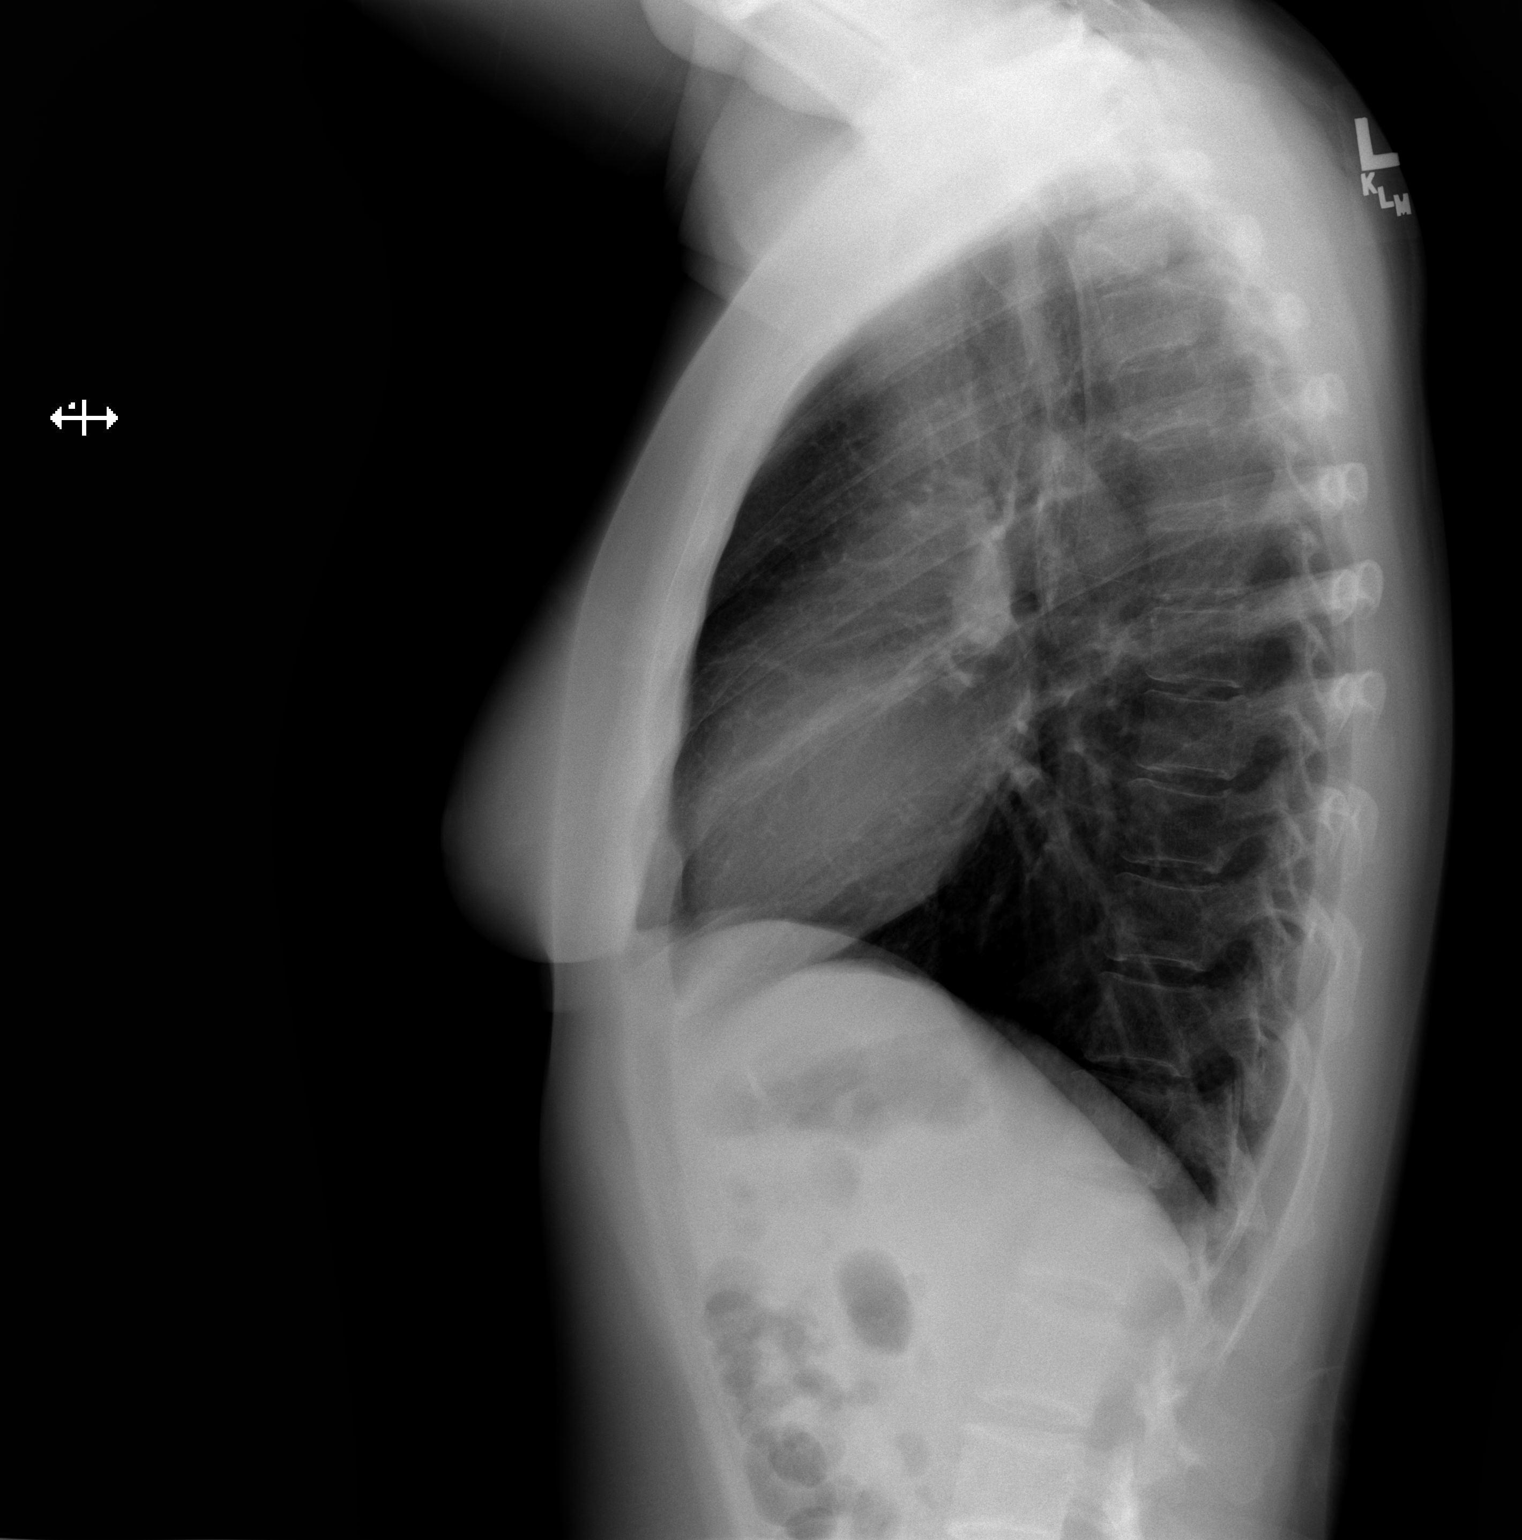

[2 of 2 positions shown; findings below may reference images not displayed]

FINDINGS: The heart size and mediastinal contours are within normal limits.
Both lungs are clear. The visualized skeletal structures are
unremarkable.
IMPRESSION: No active cardiopulmonary disease.

## 2021-02-10 ENCOUNTER — Encounter (HOSPITAL_COMMUNITY): Payer: Self-pay | Admitting: *Deleted

## 2021-02-10 ENCOUNTER — Emergency Department (HOSPITAL_COMMUNITY)
Admission: EM | Admit: 2021-02-10 | Discharge: 2021-02-11 | Disposition: A | Discharge status: Home / Self Care | Payer: Self-pay | Attending: Emergency Medicine | Admitting: Emergency Medicine

## 2021-02-10 ENCOUNTER — Other Ambulatory Visit: Payer: Self-pay

## 2021-02-10 ENCOUNTER — Emergency Department (HOSPITAL_COMMUNITY): Payer: Self-pay

## 2021-02-10 DIAGNOSIS — Y92481 Parking lot as the place of occurrence of the external cause: Secondary | ICD-10-CM | POA: Insufficient documentation

## 2021-02-10 DIAGNOSIS — S2231XA Fracture of one rib, right side, initial encounter for closed fracture: Secondary | ICD-10-CM | POA: Insufficient documentation
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
## 2021-02-10 MED ORDER — KETOROLAC TROMETHAMINE 60 MG/2ML IM SOLN
60.0000 mg | Freq: Once | INTRAMUSCULAR | Status: AC
  Administered 2021-02-11: 60 mg via INTRAMUSCULAR
  Filled 2021-02-10: qty 2
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
## 2021-02-11 MED ORDER — HYDROCODONE-ACETAMINOPHEN 5-325 MG PO TABS: 1.0000 | ORAL_TABLET | ORAL | 0 refills | Status: DC | PRN

## 2021-02-11 MED ORDER — IBUPROFEN 800 MG PO TABS: 800.0000 mg | ORAL_TABLET | Freq: Three times a day (TID) | ORAL | 0 refills | Status: DC

## 2021-02-11 NOTE — ED Notes (Signed)
Patient verbalizes understanding of discharge instructions. Prescriptions and follow-up care reviewed. Opportunity for questioning and answers were provided. Armband removed by staff, pt discharged from ED ambulatory to wheelchair. Pt wheeled out to lobby by staff.

## 2021-02-11 NOTE — Discharge Instructions (Signed)
You will continue to have pain with the broken rib, especially with moving and deep breathing.  It will take a while for this to fully resolve. Take the prescribed medication as directed. DO NOT MIX WITH ALCOHOL. Follow-up with your primary care doctor. Return to the ED for new or worsening symptoms.

## 2021-05-09 ENCOUNTER — Other Ambulatory Visit: Payer: Self-pay

## 2021-05-09 ENCOUNTER — Encounter (HOSPITAL_COMMUNITY): Payer: Self-pay | Admitting: Emergency Medicine

## 2021-05-09 ENCOUNTER — Emergency Department (HOSPITAL_COMMUNITY): Payer: Self-pay

## 2021-05-09 ENCOUNTER — Emergency Department (HOSPITAL_COMMUNITY)
Admission: EM | Admit: 2021-05-09 | Discharge: 2021-05-10 | Disposition: A | Discharge status: Home / Self Care | Payer: Self-pay | Attending: Emergency Medicine | Admitting: Emergency Medicine

## 2021-05-09 DIAGNOSIS — R1011 Right upper quadrant pain: Secondary | ICD-10-CM

## 2021-05-09 DIAGNOSIS — N3000 Acute cystitis without hematuria: Secondary | ICD-10-CM

## 2021-05-09 HISTORY — DX: Hypotension, unspecified: I95.9

## 2021-05-09 HISTORY — DX: Depression, unspecified: F32.A

## 2021-05-09 LAB — CBC
HCT: 41.6 % (ref 36.0–46.0)
Hemoglobin: 14.6 g/dL (ref 12.0–15.0)
MCH: 30.5 pg (ref 26.0–34.0)
MCHC: 35.1 g/dL (ref 30.0–36.0)
MCV: 86.8 fL (ref 80.0–100.0)
Platelets: 293 10*3/uL (ref 150–400)
RBC: 4.79 MIL/uL (ref 3.87–5.11)
RDW: 12.1 % (ref 11.5–15.5)
WBC: 8.5 10*3/uL (ref 4.0–10.5)
nRBC: 0 % (ref 0.0–0.2)

## 2021-05-09 LAB — COMPREHENSIVE METABOLIC PANEL
ALT: 33 U/L (ref 0–44)
AST: 29 U/L (ref 15–41)
Albumin: 3.6 g/dL (ref 3.5–5.0)
Alkaline Phosphatase: 101 U/L (ref 38–126)
Anion gap: 11 (ref 5–15)
BUN: 12 mg/dL (ref 6–20)
CO2: 24 mmol/L (ref 22–32)
Calcium: 9 mg/dL (ref 8.9–10.3)
Chloride: 99 mmol/L (ref 98–111)
Creatinine, Ser: 0.84 mg/dL (ref 0.44–1.00)
GFR, Estimated: >60 mL/min (ref >60)
Glucose, Bld: 96 mg/dL (ref 70–99)
Potassium: 3.7 mmol/L (ref 3.5–5.1)
Sodium: 134 mmol/L — ABNORMAL LOW (ref 135–145)
Total Bilirubin: 0.5 mg/dL (ref 0.3–1.2)
Total Protein: 7.3 g/dL (ref 6.5–8.1)

## 2021-05-09 LAB — URINALYSIS, ROUTINE W REFLEX MICROSCOPIC
Bilirubin Urine: NEGATIVE
Glucose, UA: NEGATIVE mg/dL
Hgb urine dipstick: NEGATIVE
Ketones, ur: NEGATIVE mg/dL
Leukocytes,Ua: NEGATIVE
Nitrite: POSITIVE — AB
Protein, ur: NEGATIVE mg/dL
Specific Gravity, Urine: 1.013 (ref 1.005–1.030)
pH: 6 (ref 5.0–8.0)

## 2021-05-09 LAB — I-STAT BETA HCG BLOOD, ED (MC, WL, AP ONLY): I-stat hCG, quantitative: <5 m[IU]/mL (ref <5)

## 2021-05-09 LAB — LIPASE, BLOOD: Lipase: 38 U/L (ref 11–51)

## 2021-05-09 IMAGING — CT CT ABD-PELV W/ CM
2 of 4 series · 16 of 46 positions shown, 18 images · IV contrast (APPLIED)
Comparison: None.

CLINICAL DATA: Abdomen pain

EXAM:
CT ABDOMEN AND PELVIS WITH CONTRAST
TECHNIQUE: Multidetector CT imaging of the abdomen and pelvis was performed
using the standard protocol following bolus administration of
intravenous contrast.

[Series 3: abdomen 5.0 · axial · 0.91mm/px · z∈[-577,-182]mm · 13 of 91 slices shown, 15 images]
[im 6/91  soft-tissue]
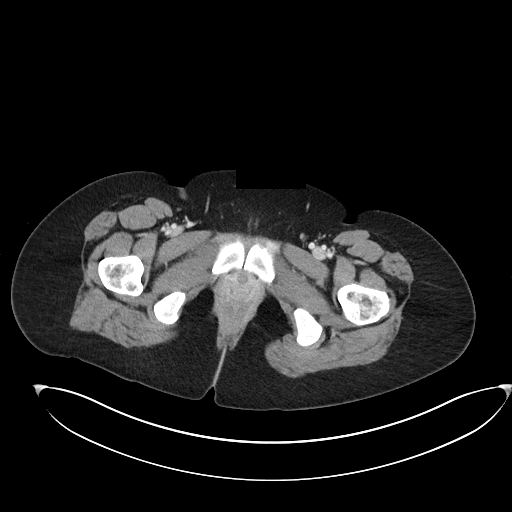
[im 6/91  bone]
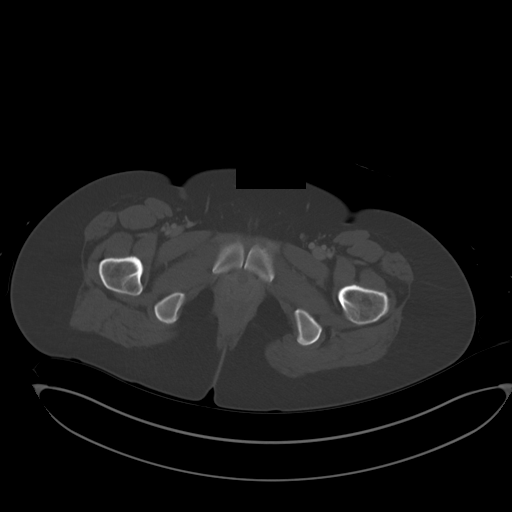
[im 12/91  soft-tissue]
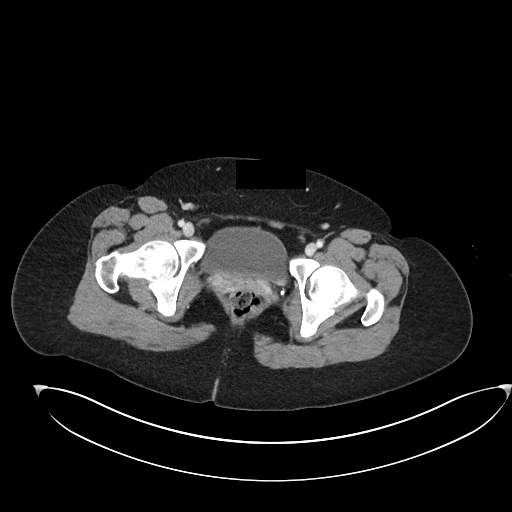
[im 17/91  soft-tissue]
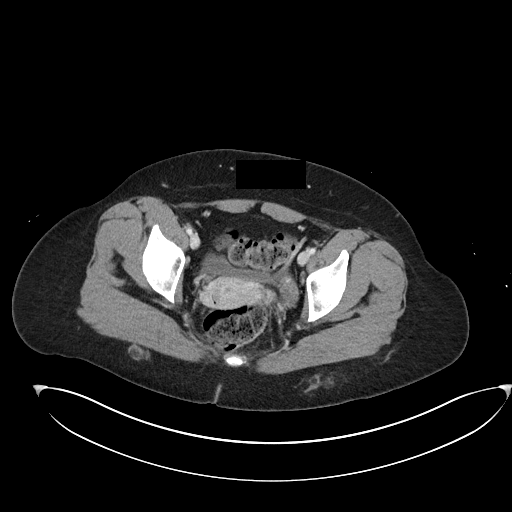
[im 29/91  soft-tissue]
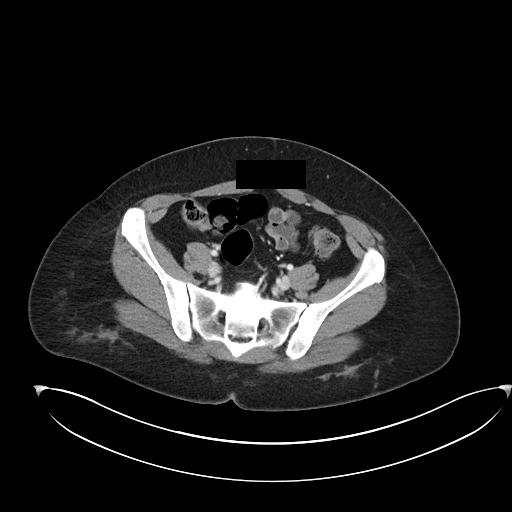
[im 34/91  soft-tissue]
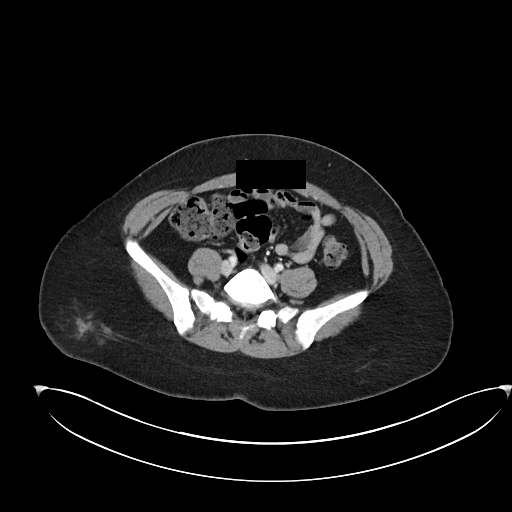
[im 40/91  soft-tissue]
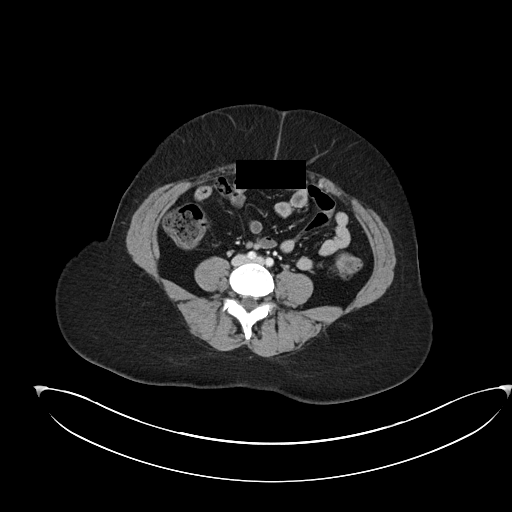
[im 46/91  soft-tissue]
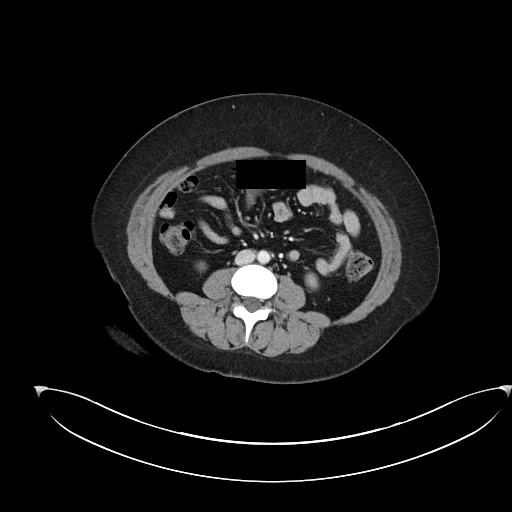
[im 51/91  soft-tissue]
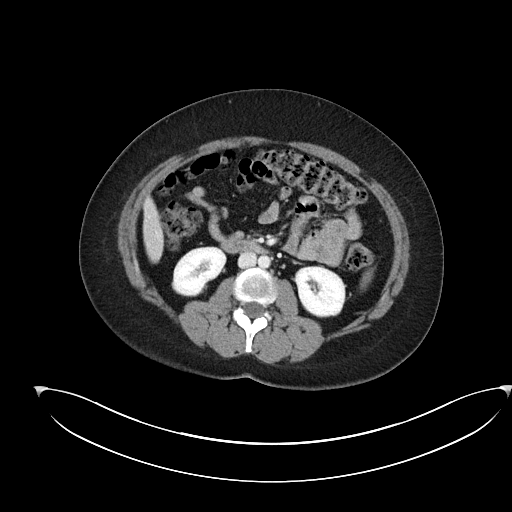
[im 57/91  soft-tissue]
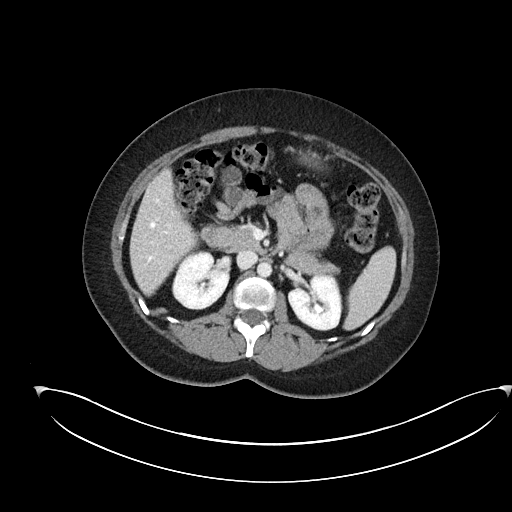
[im 57/91  bone]
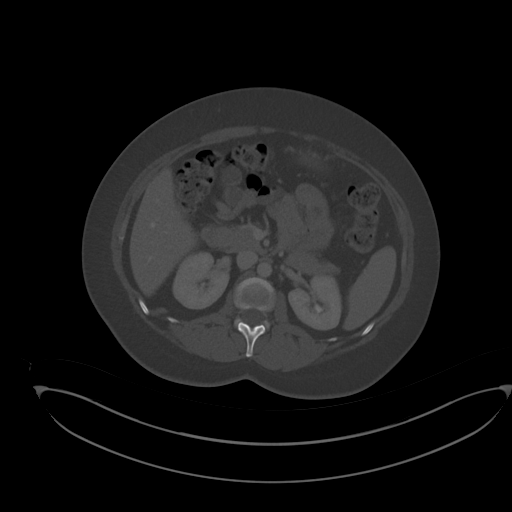
[im 62/91  soft-tissue]
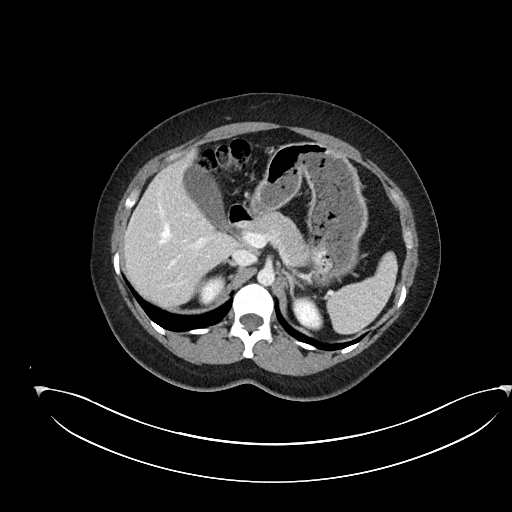
[im 74/91  soft-tissue]
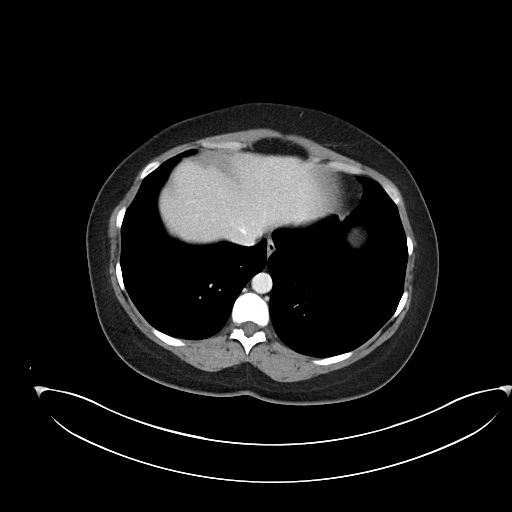
[im 79/91  soft-tissue]
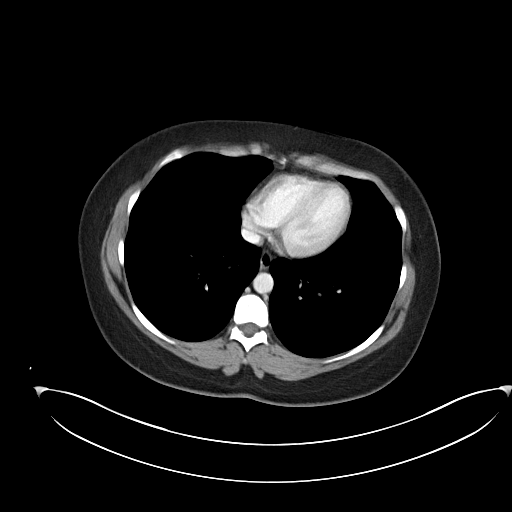
[im 85/91  soft-tissue]
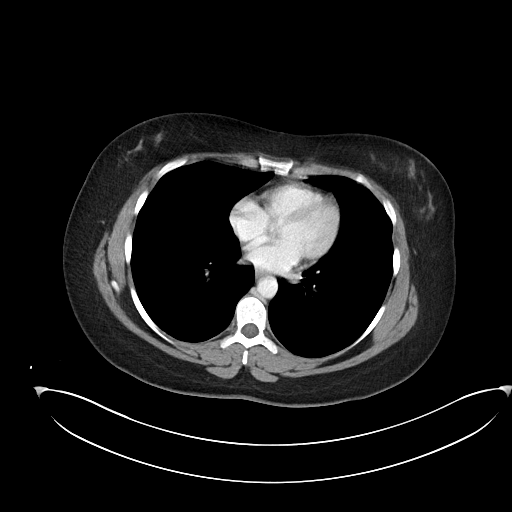

[Series 6: abdomen 3.0 mpr cor · coronal · 0.77mm/px · 3 of 101 slices shown]
[im 34/101  soft-tissue]
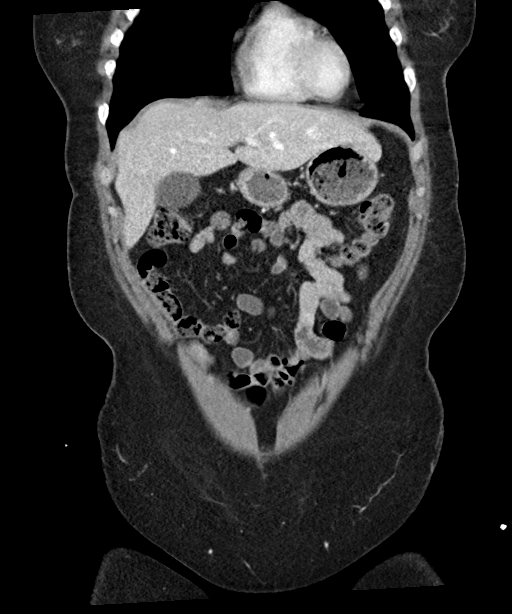
[im 45/101  soft-tissue]
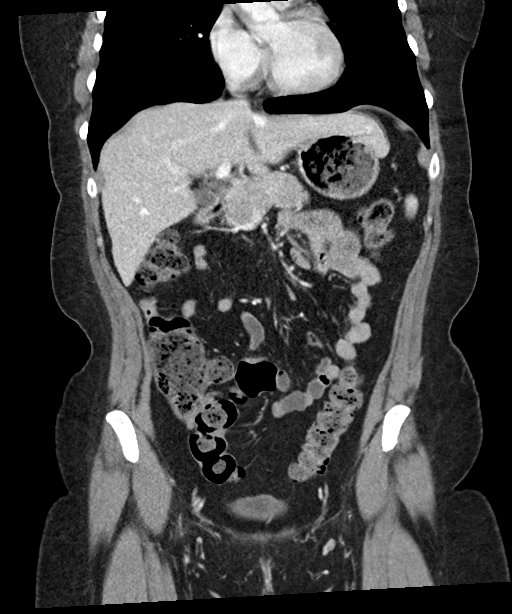
[im 56/101  soft-tissue]
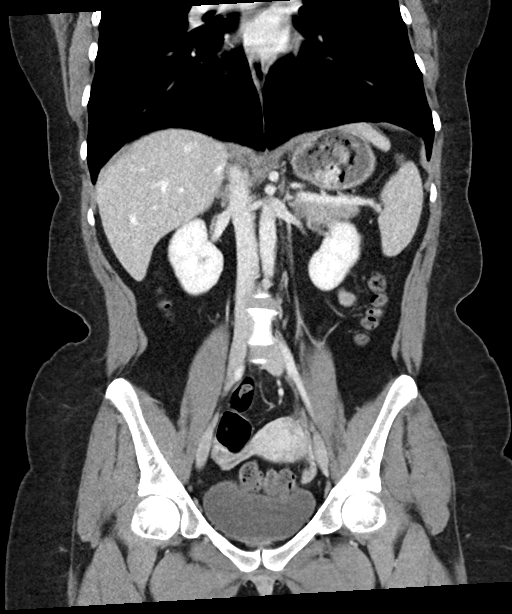

[16 of 46 positions shown; findings below may reference images not displayed]

RADIATION DOSE REDUCTION: This exam was performed according to the
departmental dose-optimization program which includes automated
exposure control, adjustment of the mA and/or kV according to
patient size and/or use of iterative reconstruction technique.

CONTRAST:  80mL OMNIPAQUE IOHEXOL 300 MG/ML  SOLN
FINDINGS: Lower chest: No acute abnormality.

Hepatobiliary: No focal liver abnormality is seen. No gallstones,
gallbladder wall thickening, or biliary dilatation.

Pancreas: Unremarkable. No pancreatic ductal dilatation or
surrounding inflammatory changes.

Spleen: Normal in size without focal abnormality.

Adrenals/Urinary Tract: Adrenal glands are unremarkable. Kidneys are
normal, without renal calculi, focal lesion, or hydronephrosis.
Bladder is unremarkable.

Stomach/Bowel: Stomach is within normal limits. Appendix appears
normal. No evidence of bowel wall thickening, distention, or
inflammatory changes.

Vascular/Lymphatic: No significant vascular findings are present. No
enlarged abdominal or pelvic lymph nodes.

Reproductive: Uterus and bilateral adnexa are unremarkable.

Other: Negative for pelvic effusion or free air.

Musculoskeletal: Mild subcutaneous stranding within the gluteal
regions possibly due to subcutaneous injection or prior trauma. No
acute osseous abnormality.
IMPRESSION: No CT evidence for acute intra-abdominal or pelvic abnormality.

## 2021-05-09 IMAGING — US US ABDOMEN LIMITED
1 series · 14 of 25 positions shown · non-contrast
Comparison: None.

CLINICAL DATA: Right upper quadrant pain.

EXAM:
ULTRASOUND ABDOMEN LIMITED RIGHT UPPER QUADRANT

[Series 1: us abdomen limited ruq (liver/gb) · 14 of 29 slices shown]
[im 1/29]
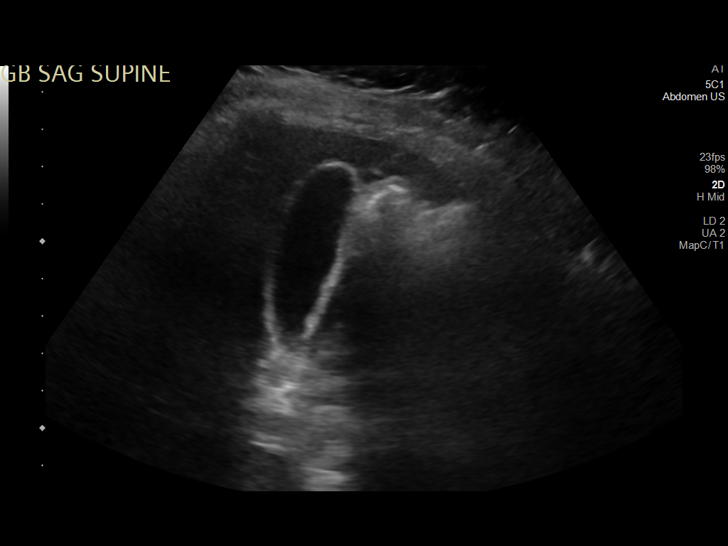
[im 3/29]
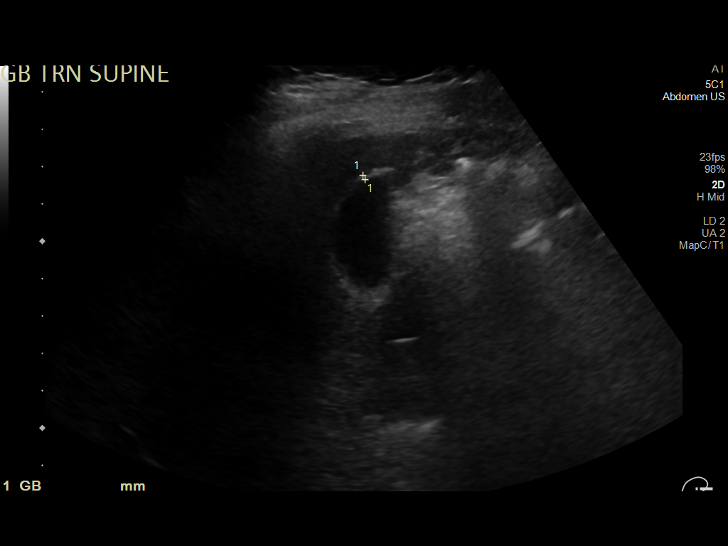
[im 5/29]
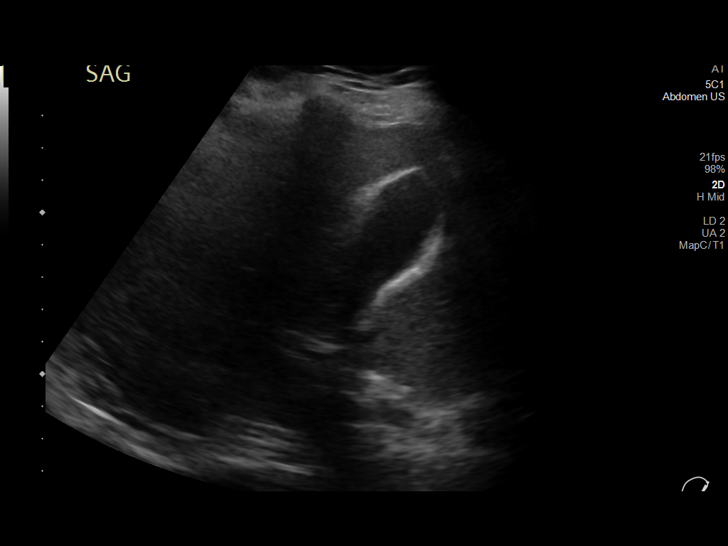
[im 8/29]
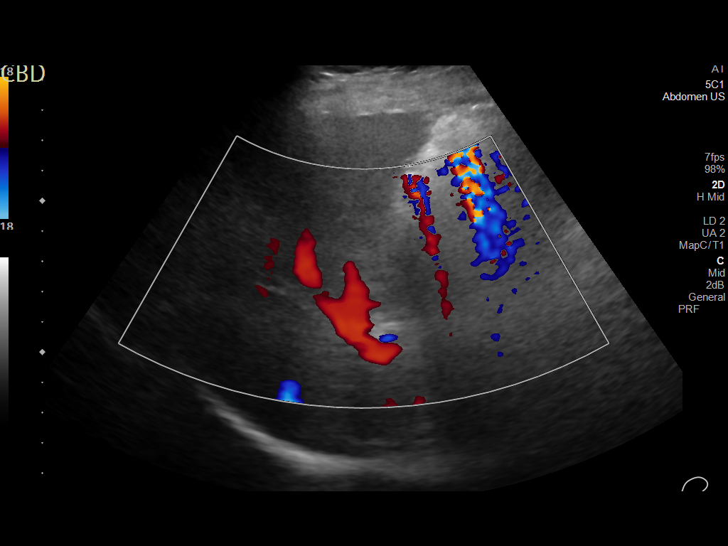
[im 10/29]
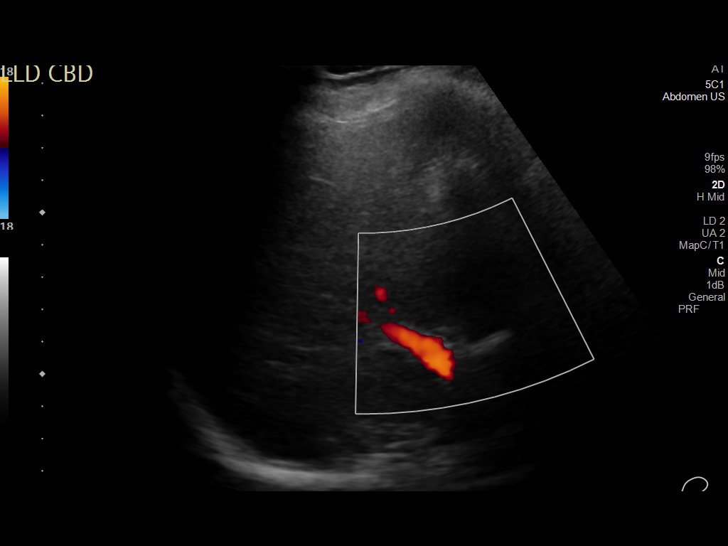
[im 11/29]
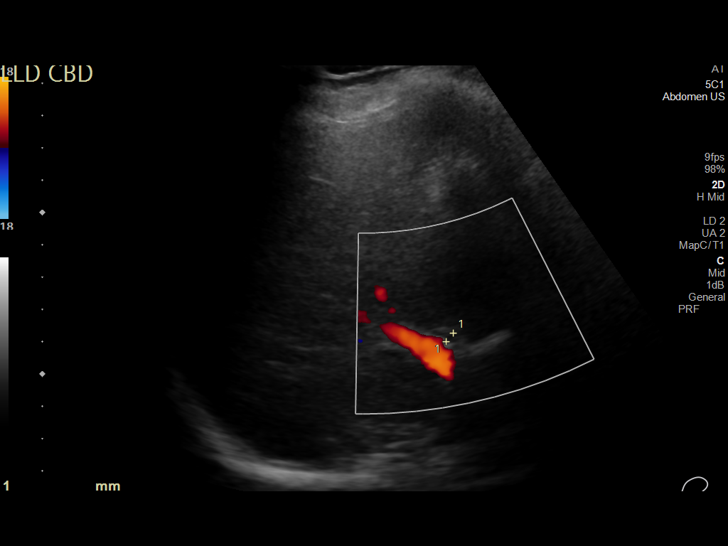
[im 13/29]
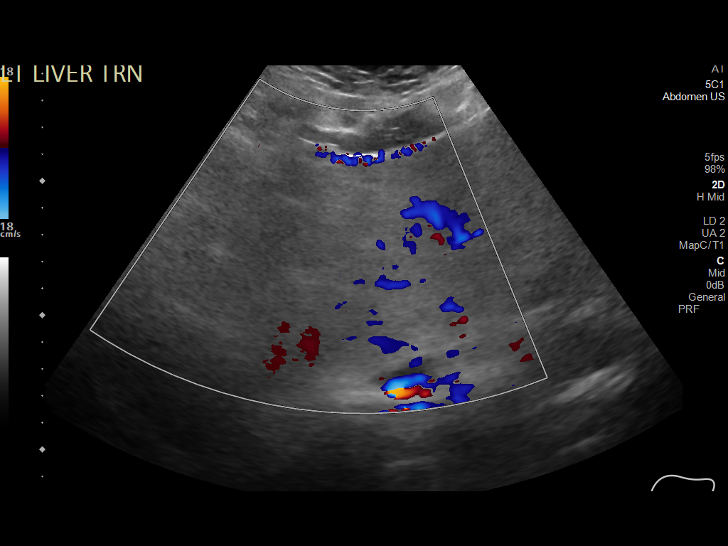
[im 16/29]
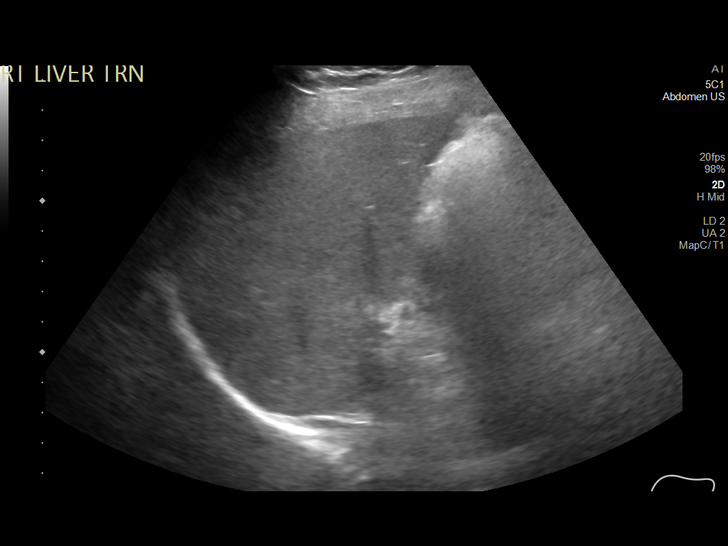
[im 18/29]
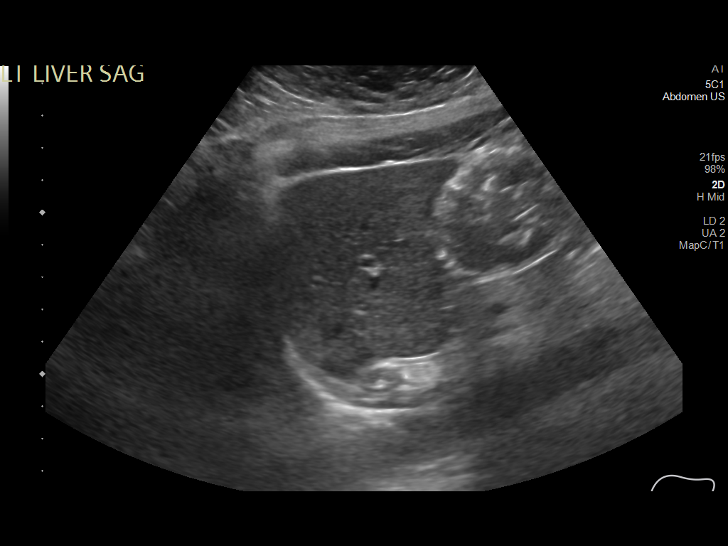
[im 19/29]
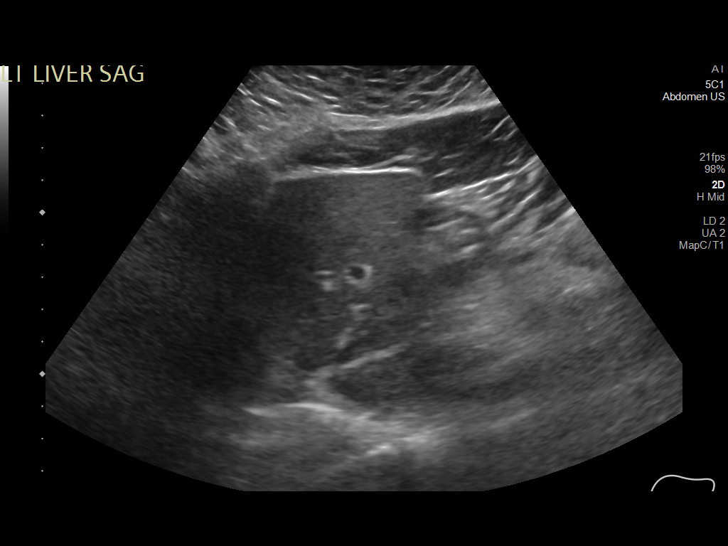
[im 22/29]
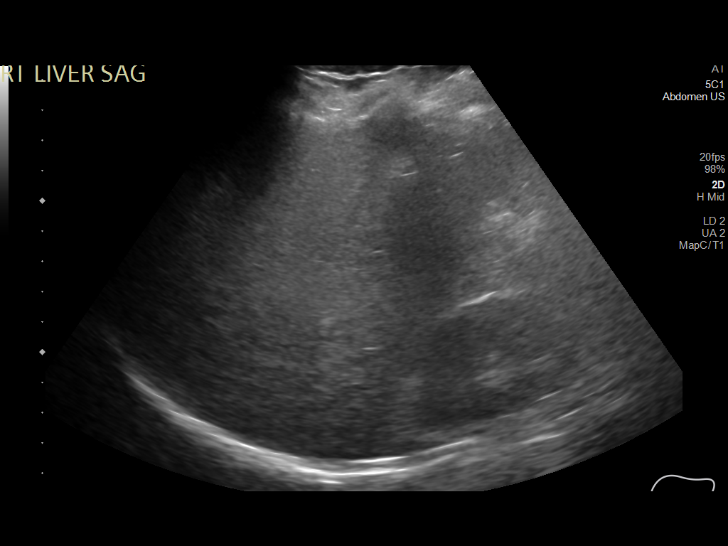
[im 24/29]
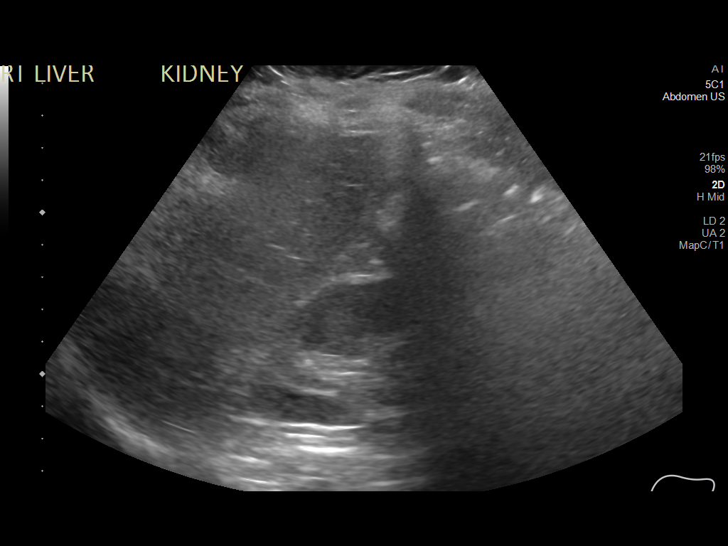
[im 26/29]
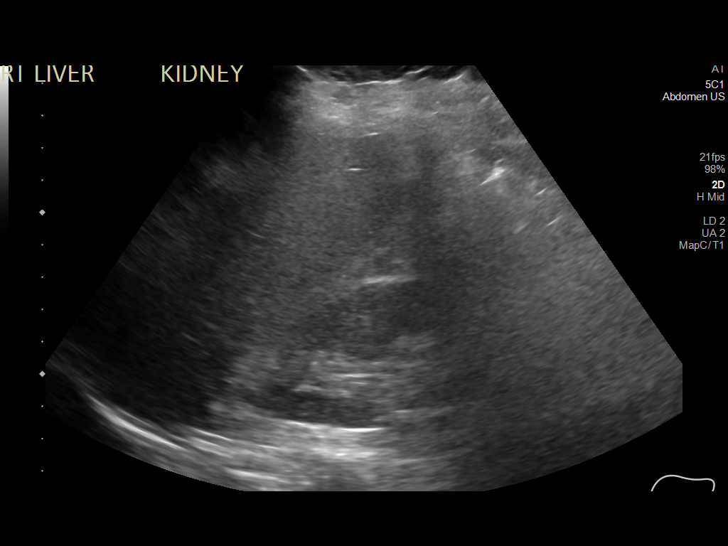
[im 29/29]
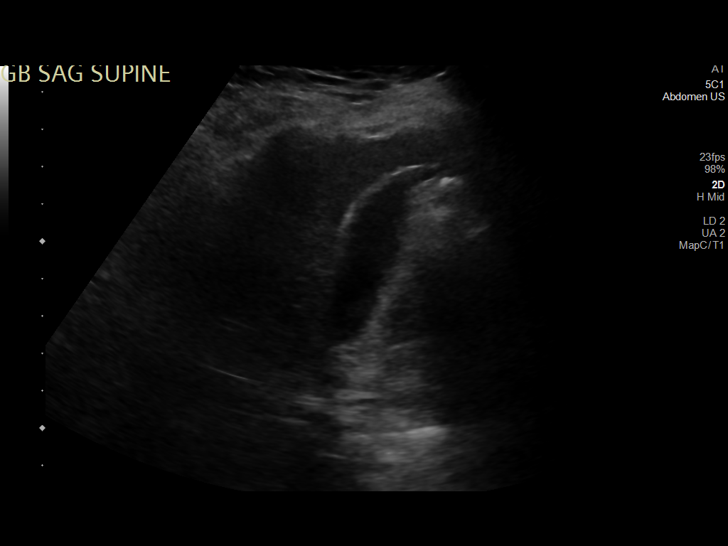

[14 of 25 positions shown; findings below may reference images not displayed]

FINDINGS: Gallbladder:

No gallstones or wall thickening visualized. No sonographic Murphy
sign noted by sonographer. No pericholecystic fluid.

Common bile duct:

Diameter: 3 mm

Liver:

No focal lesion identified. Increased echogenicity of the liver
diffusely suggesting fatty infiltration. Portal vein is patent on
color Doppler imaging with normal direction of blood flow towards
the liver.

Other: None.
IMPRESSION: :
IMPRESSION: 1. Normal gallbladder.
2. Increased echogenicity of the liver compatible with fatty
infiltration.

## 2021-05-09 MED ORDER — IOHEXOL 300 MG/ML  SOLN
80.0000 mL | Freq: Once | INTRAMUSCULAR | Status: AC | PRN
  Administered 2021-05-09: 80 mL via INTRAVENOUS

## 2021-05-09 MED ORDER — OXYCODONE-ACETAMINOPHEN 5-325 MG PO TABS
1.0000 | ORAL_TABLET | Freq: Once | ORAL | Status: AC
  Administered 2021-05-09: 1 via ORAL
  Filled 2021-05-09: qty 1

## 2021-05-09 NOTE — ED Triage Notes (Signed)
Pt c/o abdominal discomfort and right flank pain for several days. She states she often has problems with her gallbladder and her kidneys.  He is complaining of right upper quadrant pain that radiates to her back.  She reports vomiting, dysuria, increased urinary frequency

## 2021-05-09 NOTE — ED Provider Triage Note (Signed)
Emergency Medicine Provider Triage Evaluation Note  Kimberly Poole , a 36 y.o. female  was evaluated in triage.  Pt complains of abdominal discomfort and flank pain for several days. She states she often has problems with her gallbladder and her kidneys.  He is complaining of right upper quadrant pain that radiates to her back.  She reports vomiting, dysuria, increased urinary frequency.  Review of Systems  Positive: Abdominal pain, flank pain, vomiting, dysuria, increased urinary frequency, chills Negative: Fever, diarrhea, hematuria  Physical Exam  BP 97/78 (BP Location: Right Arm)    Pulse (!) 103    Temp 98 F (36.7 C) (Oral)    Resp 18    SpO2 100%  Gen:   Awake, no distress   Resp:  Normal effort  MSK:   Moves extremities without difficulty  Other:  Patient holding right side and appears uncomfortable  Medical Decision Making  Medically screening exam initiated at 6:46 PM.  Appropriate orders placed.  Pearline Cables Kimberly Poole was informed that the remainder of the evaluation will be completed by another provider, this initial triage assessment does not replace that evaluation, and the importance of remaining in the ED until their evaluation is complete.     Kateri Plummer, PA-C 05/09/21 1846

## 2021-05-10 MED ORDER — IBUPROFEN 800 MG PO TABS: 800.0000 mg | ORAL_TABLET | Freq: Three times a day (TID) | ORAL | 0 refills | Status: DC

## 2021-05-10 MED ORDER — ALUM & MAG HYDROXIDE-SIMETH 200-200-20 MG/5ML PO SUSP
30.0000 mL | Freq: Once | ORAL | Status: AC
  Administered 2021-05-10: 30 mL via ORAL
  Filled 2021-05-10: qty 30

## 2021-05-10 MED ORDER — LIDOCAINE VISCOUS HCL 2 % MT SOLN
15.0000 mL | Freq: Once | OROMUCOSAL | Status: AC
  Administered 2021-05-10: 15 mL via ORAL
  Filled 2021-05-10: qty 15

## 2021-05-10 MED ORDER — KETOROLAC TROMETHAMINE 30 MG/ML IJ SOLN
30.0000 mg | Freq: Once | INTRAMUSCULAR | Status: AC
  Administered 2021-05-10: 30 mg via INTRAVENOUS
  Filled 2021-05-10: qty 1

## 2021-05-10 MED ORDER — ONDANSETRON HCL 4 MG/2ML IJ SOLN
4.0000 mg | Freq: Once | INTRAMUSCULAR | Status: AC
  Administered 2021-05-10: 4 mg via INTRAVENOUS
  Filled 2021-05-10: qty 2

## 2021-05-10 MED ORDER — CEPHALEXIN 500 MG PO CAPS: 500.0000 mg | ORAL_CAPSULE | Freq: Two times a day (BID) | ORAL | 0 refills | Status: DC

## 2021-05-10 MED ORDER — FAMOTIDINE IN NACL 20-0.9 MG/50ML-% IV SOLN
20.0000 mg | Freq: Once | INTRAVENOUS | Status: AC
  Administered 2021-05-10: 20 mg via INTRAVENOUS
  Filled 2021-05-10: qty 50

## 2021-05-10 MED ORDER — PHENAZOPYRIDINE HCL 95 MG PO TABS: 95.0000 mg | ORAL_TABLET | Freq: Three times a day (TID) | ORAL | 0 refills | Status: DC | PRN

## 2021-05-10 MED ORDER — CEFTRIAXONE SODIUM 1 G IJ SOLR
1.0000 g | Freq: Once | INTRAMUSCULAR | Status: AC
  Administered 2021-05-10: 1 g via INTRAVENOUS
  Filled 2021-05-10: qty 10

## 2021-05-10 MED ORDER — SODIUM CHLORIDE 0.9 % IV BOLUS
1000.0000 mL | Freq: Once | INTRAVENOUS | Status: AC
  Administered 2021-05-10: 1000 mL via INTRAVENOUS

## 2021-05-10 NOTE — ED Notes (Signed)
Pt vomited post gi cocktail

## 2021-05-10 NOTE — ED Provider Notes (Signed)
[facility] Emergency Department Provider Note MRN:  [id number]  Arrival date & time: 05/10/21     Chief Complaint   Abdominal Pain   History of Present Illness   Kimberly Poole is a 36 y.o. year-old female presents to the ED with chief complaint of right-sided flank pain, dysuria, and urinary frequency.  She reports history of gallbladder disease, and questions whether she is having gallbladder colic.  She denies nausea or vomiting.  Denies fevers or chills.  Denies treatment prior to arrival.   History provided by significant other, who interprets. Review of Systems  Pertinent review of systems noted in HPI.    Physical Exam   Vitals:   05/09/21 2342 05/10/21 0200  BP: 105/77 95/76  Pulse: 74 80  Resp: 17 17  Temp: 98.3 F (36.8 C)   SpO2: 100% 99%    CONSTITUTIONAL:  non toxic-appearing, NAD NEURO:  Alert and oriented x 3, CN 3-12 grossly intact EYES:  eyes equal and reactive ENT/NECK:  Supple, no stridor  CARDIO:  regular rate, regular rhythm, appears well-perfused  PULM:  No respiratory distress, CTAB GI/GU:  non-distended, right sided abdominal tenderness MSK/SPINE:  No gross deformities, no edema, moves all extremities  SKIN:  no rash, atraumatic   *Additional and/or pertinent findings included in MDM below  Diagnostic and Interventional Summary    EKG Interpretation  Date/Time:    Ventricular Rate:    PR Interval:    QRS Duration:   QT Interval:    QTC Calculation:   R Axis:     Text Interpretation:         Labs Reviewed  URINALYSIS, ROUTINE W REFLEX MICROSCOPIC - Abnormal; Notable for the following components:      Result Value   Color, Urine ORANGE (*)    Nitrite POSITIVE (*)    Bacteria, UA MANY (*)    All other components within normal limits  COMPREHENSIVE METABOLIC PANEL - Abnormal; Notable for the following components:   Sodium 134 (*)    All other components within normal limits  LIPASE,  BLOOD  CBC  I-STAT BETA HCG BLOOD, ED ([facility], [facility], [facility] ONLY)    CT ABDOMEN PELVIS W CONTRAST  Final Result    US Abdomen Limited RUQ (LIVER/GB)  Final Result      Medications  sodium chloride 0.9 % bolus 1,000 mL (has no administration in time range)  cefTRIAXone (ROCEPHIN) 1 g in sodium chloride 0.9 % 100 mL IVPB (1 g Intravenous New Bag/Given 05/10/21 0218)  oxyCODONE-acetaminophen (PERCOCET/ROXICET) 5-325 MG per tablet 1 tablet (1 tablet Oral Given 05/09/21 1914)  iohexol (OMNIPAQUE) 300 MG/ML solution 80 mL (80 mLs Intravenous Contrast Given 05/09/21 2255)  ketorolac (TORADOL) 30 MG/ML injection 30 mg (30 mg Intravenous Given 05/10/21 0217)  ondansetron (ZOFRAN) injection 4 mg (4 mg Intravenous Given 05/10/21 0217)     Procedures  /  Critical Care Procedures  ED Course and Medical Decision Making  I have reviewed the triage vital signs, the nursing notes, and pertinent available records from the EMR.  Complexity of Problems Addressed: High Complexity: Acute illness/injury posing a threat to life or bodily function, requiring emergent diagnostic workup, evaluation, and treatment as below.  ED Course:    After considering the following differential, cholecystitis, kidney stone, appendicitis, UTI, pyelonephritis, I ordered Rocephin and Toradol for treatment.  Agree with work-up ordered in triage.  I personally interpreted the labs which are notable for positive nitrite urinalysis I visualized the  CT, which is notable for no obvious inflammation of the appendix, no visible kidney stones and agree with radiologist interpretation. I visualized the [place] which is notable for no gallstones or gallbladder wall thickening and agree with radiologist interpretation.  Social Determinants Affecting Care: Access to medical care  SW/CM consulted ordered for assistance with PCP and establishing care given that she is new to the [place].  Consultants: No consultations were needed in caring for this  patient.   Treatment and Plan:  Patient will be treated for UTI and probable early pyelonephritis with Rocephin in the ED.  Plan for discharge home on Keflex x2 weeks and AZO.  I considered admission due to patient's initial presentation, but after considering the examination and diagnostic results, patient will not require admission and can be discharged with outpatient follow-up.    Final Clinical Impressions(s) / ED Diagnoses     ICD-10-CM   1. Acute cystitis without hematuria  N30.00     2. RUQ pain  R10.11 US Abdomen Limited RUQ (LIVER/GB)    US Abdomen Limited RUQ (LIVER/GB)      ED Discharge Orders     None        Discharge Instructions Discussed with and Provided to Patient:   Discharge Instructions   None      Montine Circle, PA-C 05/10/21 0302    Maudie Flakes, MD 05/10/21 [phone]

## 2021-05-16 ENCOUNTER — Encounter (HOSPITAL_COMMUNITY): Payer: Self-pay | Admitting: *Deleted

## 2021-07-29 ENCOUNTER — Other Ambulatory Visit: Payer: Self-pay

## 2021-07-29 ENCOUNTER — Other Ambulatory Visit (HOSPITAL_COMMUNITY): Payer: Self-pay

## 2021-07-29 ENCOUNTER — Inpatient Hospital Stay (HOSPITAL_COMMUNITY): Payer: Self-pay

## 2021-07-29 ENCOUNTER — Inpatient Hospital Stay (HOSPITAL_COMMUNITY)
Admission: AD | Admit: 2021-07-29 | Discharge: 2021-07-29 | Disposition: A | Discharge status: Home / Self Care | Payer: Self-pay | Attending: Obstetrics & Gynecology | Admitting: Obstetrics & Gynecology

## 2021-07-29 ENCOUNTER — Encounter (HOSPITAL_COMMUNITY): Payer: Self-pay | Admitting: Emergency Medicine

## 2021-07-29 DIAGNOSIS — O3680X0 Pregnancy with inconclusive fetal viability, not applicable or unspecified: Secondary | ICD-10-CM

## 2021-07-29 DIAGNOSIS — O09521 Supervision of elderly multigravida, first trimester: Secondary | ICD-10-CM | POA: Insufficient documentation

## 2021-07-29 DIAGNOSIS — R1033 Periumbilical pain: Secondary | ICD-10-CM | POA: Insufficient documentation

## 2021-07-29 DIAGNOSIS — K2970 Gastritis, unspecified, without bleeding: Secondary | ICD-10-CM

## 2021-07-29 DIAGNOSIS — O26891 Other specified pregnancy related conditions, first trimester: Secondary | ICD-10-CM | POA: Insufficient documentation

## 2021-07-29 DIAGNOSIS — Z3A01 Less than 8 weeks gestation of pregnancy: Secondary | ICD-10-CM

## 2021-07-29 DIAGNOSIS — O99611 Diseases of the digestive system complicating pregnancy, first trimester: Secondary | ICD-10-CM | POA: Insufficient documentation

## 2021-07-29 DIAGNOSIS — O219 Vomiting of pregnancy, unspecified: Secondary | ICD-10-CM

## 2021-07-29 HISTORY — DX: Anxiety disorder, unspecified: F41.9

## 2021-07-29 LAB — COMPREHENSIVE METABOLIC PANEL
ALT: 21 U/L (ref 0–44)
AST: 22 U/L (ref 15–41)
Albumin: 4.2 g/dL (ref 3.5–5.0)
Alkaline Phosphatase: 82 U/L (ref 38–126)
Anion gap: 12 (ref 5–15)
BUN: 12 mg/dL (ref 6–20)
CO2: 17 mmol/L — ABNORMAL LOW (ref 22–32)
Calcium: 9.2 mg/dL (ref 8.9–10.3)
Chloride: 107 mmol/L (ref 98–111)
Creatinine, Ser: 0.87 mg/dL (ref 0.44–1.00)
GFR, Estimated: >60 mL/min (ref >60)
Glucose, Bld: 112 mg/dL — ABNORMAL HIGH (ref 70–99)
Potassium: 3.8 mmol/L (ref 3.5–5.1)
Sodium: 136 mmol/L (ref 135–145)
Total Bilirubin: 0.8 mg/dL (ref 0.3–1.2)
Total Protein: 7.4 g/dL (ref 6.5–8.1)

## 2021-07-29 LAB — CBC WITH DIFFERENTIAL/PLATELET
Abs Immature Granulocytes: 0.03 10*3/uL (ref 0.00–0.07)
Basophils Absolute: 0 10*3/uL (ref 0.0–0.1)
Basophils Relative: 0 %
Eosinophils Absolute: 0 10*3/uL (ref 0.0–0.5)
Eosinophils Relative: 0 %
HCT: 34.4 % — ABNORMAL LOW (ref 36.0–46.0)
Hemoglobin: 12.2 g/dL (ref 12.0–15.0)
Immature Granulocytes: 0 %
Lymphocytes Relative: 7 %
Lymphs Abs: 0.6 10*3/uL — ABNORMAL LOW (ref 0.7–4.0)
MCH: 30.9 pg (ref 26.0–34.0)
MCHC: 35.5 g/dL (ref 30.0–36.0)
MCV: 87.1 fL (ref 80.0–100.0)
Monocytes Absolute: 0.5 10*3/uL (ref 0.1–1.0)
Monocytes Relative: 6 %
Neutro Abs: 8 10*3/uL — ABNORMAL HIGH (ref 1.7–7.7)
Neutrophils Relative %: 87 %
Platelets: 207 10*3/uL (ref 150–400)
RBC: 3.95 MIL/uL (ref 3.87–5.11)
RDW: 12.1 % (ref 11.5–15.5)
WBC: 9.1 10*3/uL (ref 4.0–10.5)
nRBC: 0 % (ref 0.0–0.2)

## 2021-07-29 LAB — URINALYSIS, ROUTINE W REFLEX MICROSCOPIC
Bilirubin Urine: NEGATIVE
Glucose, UA: NEGATIVE mg/dL
Hgb urine dipstick: NEGATIVE
Ketones, ur: 80 mg/dL — AB
Leukocytes,Ua: NEGATIVE
Nitrite: NEGATIVE
Protein, ur: NEGATIVE mg/dL
Specific Gravity, Urine: 1.023 (ref 1.005–1.030)
pH: 6 (ref 5.0–8.0)

## 2021-07-29 LAB — CBC
HCT: 38.7 % (ref 36.0–46.0)
Hemoglobin: 13.6 g/dL (ref 12.0–15.0)
MCH: 30.1 pg (ref 26.0–34.0)
MCHC: 35.1 g/dL (ref 30.0–36.0)
MCV: 85.6 fL (ref 80.0–100.0)
Platelets: 238 10*3/uL (ref 150–400)
RBC: 4.52 MIL/uL (ref 3.87–5.11)
RDW: 11.9 % (ref 11.5–15.5)
WBC: 10.5 10*3/uL (ref 4.0–10.5)
nRBC: 0 % (ref 0.0–0.2)

## 2021-07-29 LAB — HCG, QUANTITATIVE, PREGNANCY: hCG, Beta Chain, Quant, S: 99 m[IU]/mL — ABNORMAL HIGH (ref <5)

## 2021-07-29 LAB — I-STAT BETA HCG BLOOD, ED (MC, WL, AP ONLY): I-stat hCG, quantitative: 88.5 m[IU]/mL — ABNORMAL HIGH (ref <5)

## 2021-07-29 LAB — LIPASE, BLOOD: Lipase: 30 U/L (ref 11–51)

## 2021-07-29 LAB — WET PREP, GENITAL
Sperm: NONE SEEN
Trich, Wet Prep: NONE SEEN
WBC, Wet Prep HPF POC: >=10 — AB (ref <10)
Yeast Wet Prep HPF POC: NONE SEEN

## 2021-07-29 IMAGING — MR MR PELVIS W/O CM
13 of 14 series · 41 of 48 positions shown · non-contrast
Comparison: None Available.

CT [DATE]

CLINICAL DATA: Early pregnancy right lower quadrant pain concern
for appendicitis.

EXAM:
MRI ABDOMEN AND PELVIS WITHOUT CONTRAST
TECHNIQUE: Multiplanar multisequence MR imaging of the abdomen and pelvis was
performed. No intravenous contrast was administered.

[Series 5: cor haste · coronal · 5.0mm · 1.04mm/px · 2 of 34 slices shown]
[im 1/34]
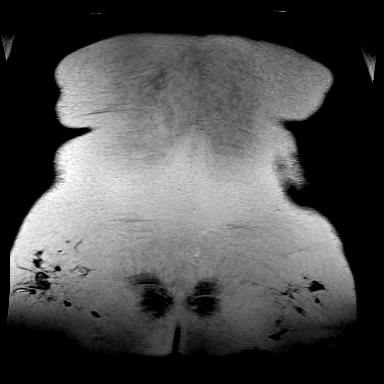
[im 34/34]
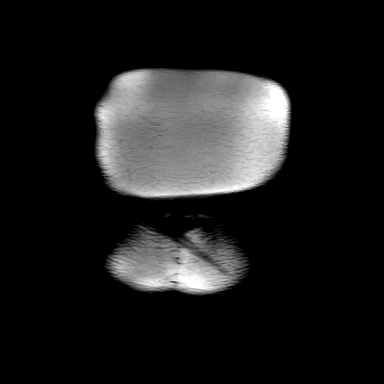

[Series 6: cor haste fs · coronal · 5.0mm · 1.04mm/px · 2 of 34 slices shown]
[im 1/34]
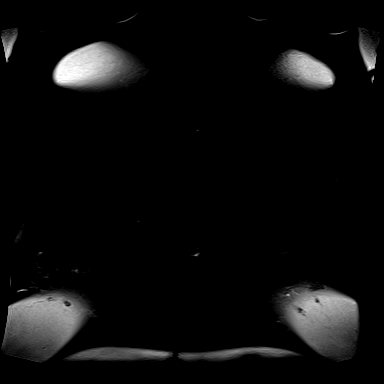
[im 34/34]
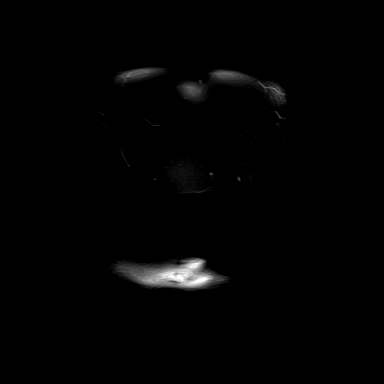

[Series 7: bSSFP · coronal · 5.0mm · 1.61mm/px · 2 of 34 slices shown (1 of 3)]
[im 1/34]
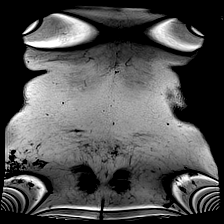
[im 34/34]
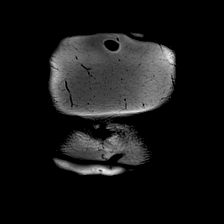

[Series 11: bSSFP · axial · 5.0mm · 0.78mm/px · z∈[-181,+11]mm · 2 of 33 slices shown (2 of 3)]
[im 1/33]
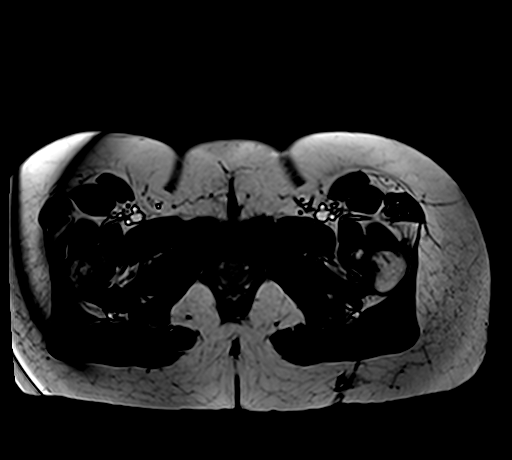
[im 33/33]
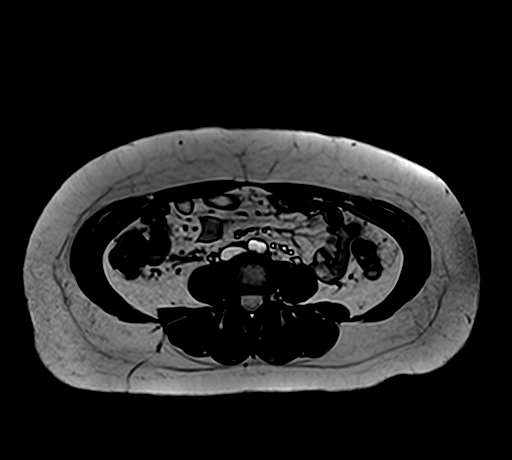

[Series 11: bSSFP · axial · 5.0mm · 1.61mm/px · z∈[+17,+197]mm · 3 of 31 slices shown (3 of 3)]
[im 1/31]
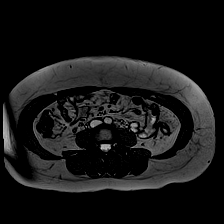
[im 16/31]
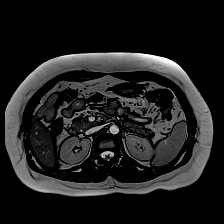
[im 31/31]
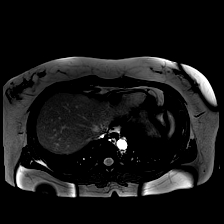

[Series 14: ax haste fs_comp · axial · 5.0mm · 1.19mm/px · z∈[-181,+11]mm · 3 of 33 slices shown (1 of 2)]
[im 1/33]
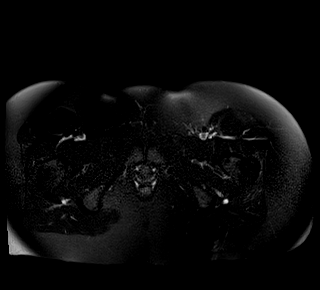
[im 17/33]
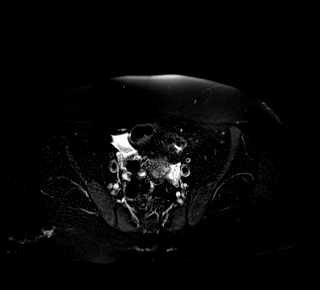
[im 33/33]
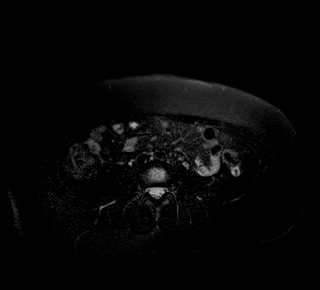

[Series 14: ax haste fs_comp · axial · 5.0mm · 0.99mm/px · z∈[+17,+197]mm · 3 of 31 slices shown (2 of 2)]
[im 1/31]
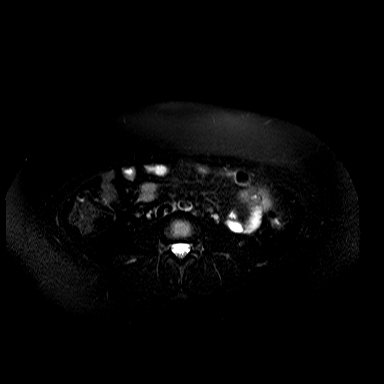
[im 16/31]
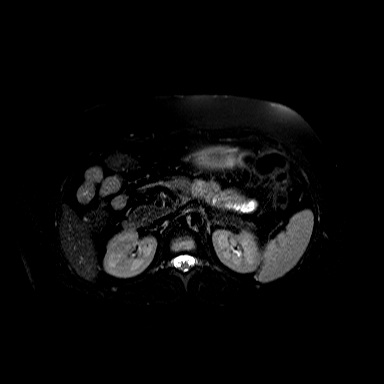
[im 31/31]
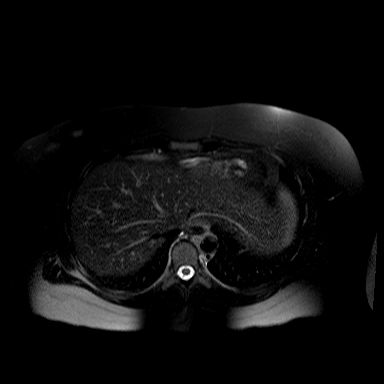

[Series 17: ax haste_comp · axial · 5.0mm · 0.99mm/px · z∈[+17,+197]mm · 3 of 31 slices shown (1 of 2)]
[im 1/31]
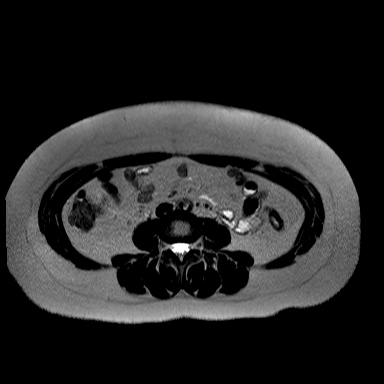
[im 16/31]
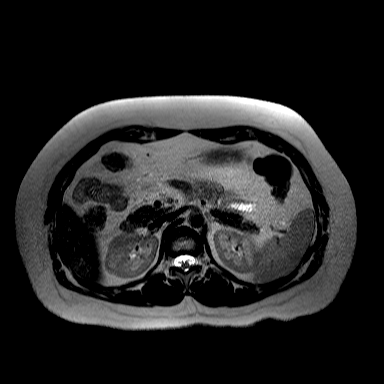
[im 31/31]
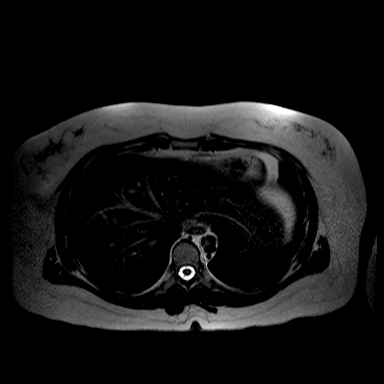

[Series 17: ax haste_comp · axial · 5.0mm · 1.19mm/px · z∈[-181,+11]mm · 3 of 33 slices shown (2 of 2)]
[im 1/33]
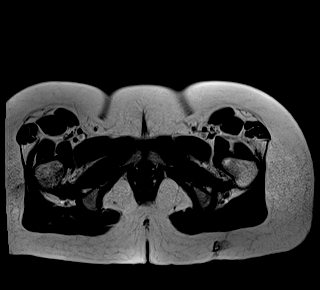
[im 17/33]
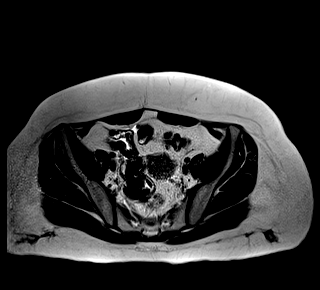
[im 33/33]
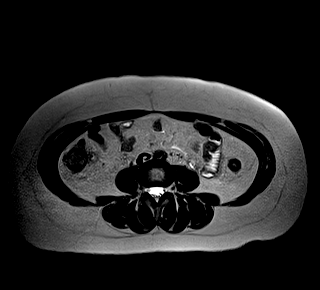

[Series 21: T1 · axial · 5.0mm · 1.48mm/px · z∈[-181,+11]mm · 3 of 33 slices shown (1 of 2)]
[im 1/33]
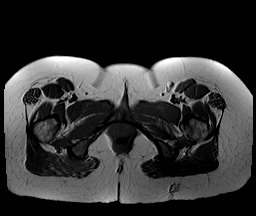
[im 17/33]
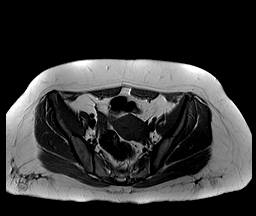
[im 33/33]
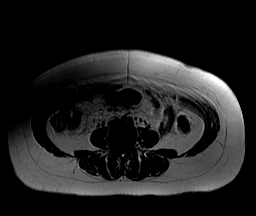

[Series 21: T1 · axial · 5.0mm · 0.70mm/px · z∈[+17,+197]mm · 3 of 31 slices shown (2 of 2)]
[im 1/31]
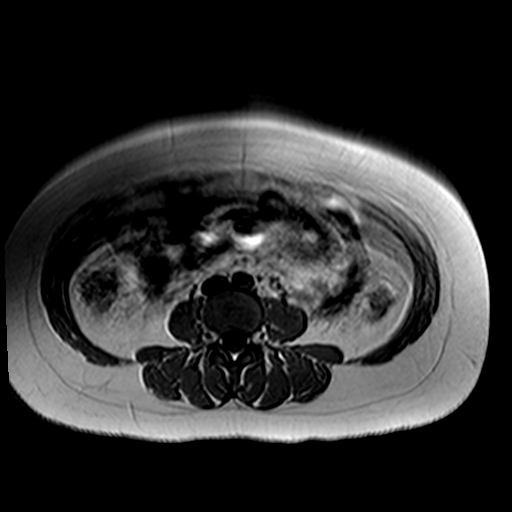
[im 16/31]
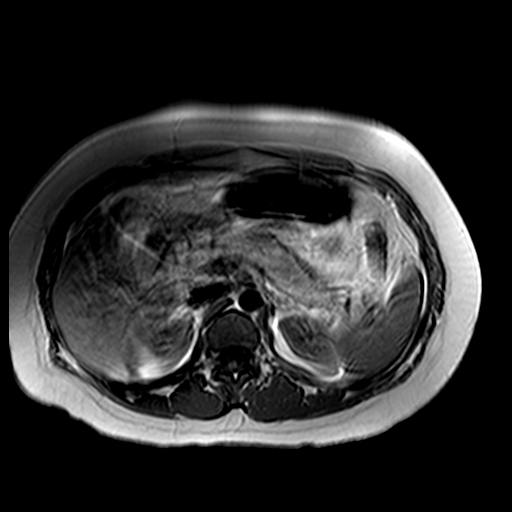
[im 31/31]
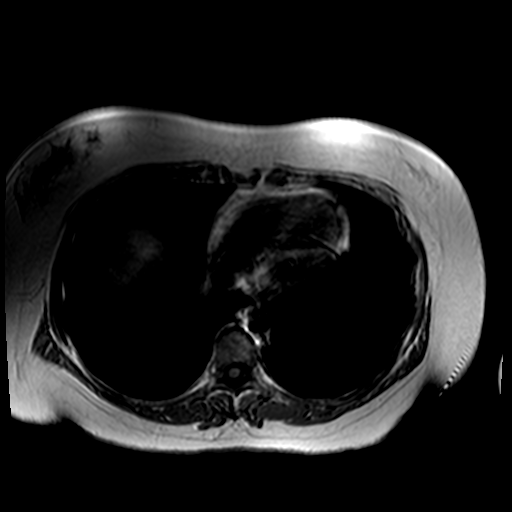

[Series 22: T1 dynamic · axial · 3.0mm · 1.41mm/px · z∈[-14,+223]mm · 7 of 80 slices shown (1 of 2)]
[im 1/80]
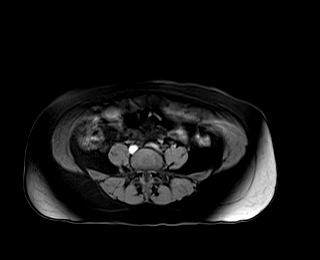
[im 14/80]
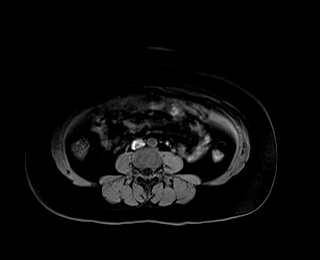
[im 27/80]
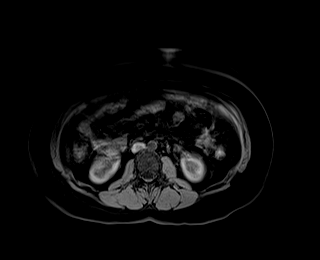
[im 40/80]
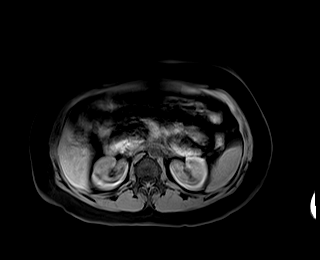
[im 53/80]
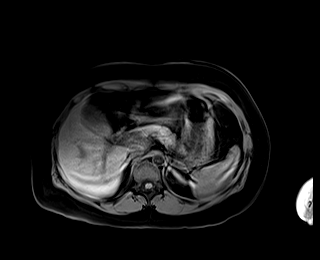
[im 66/80]
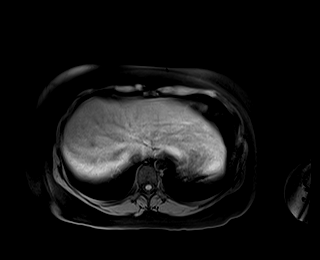
[im 80/80]
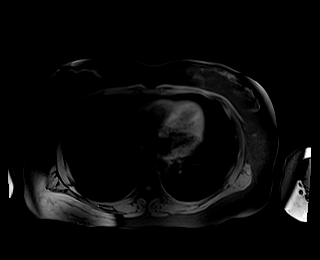

[Series 23: T1 dynamic · axial · 3.0mm · 1.41mm/px · z∈[-203,-47]mm · 5 of 80 slices shown (2 of 2)]
[im 1/80]
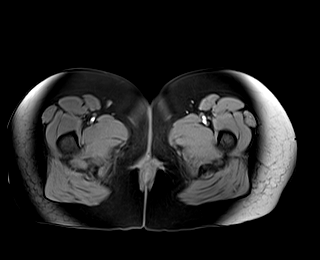
[im 14/80]
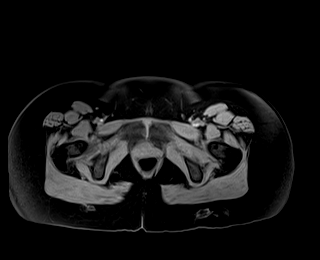
[im 27/80]
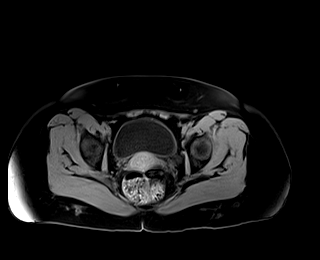
[im 40/80]
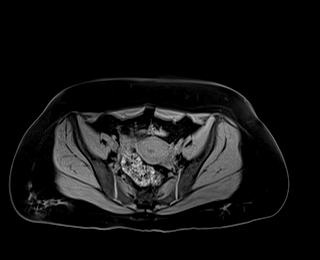
[im 53/80]
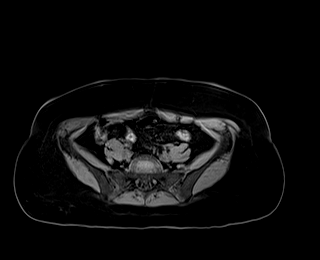

[41 of 48 positions shown; findings below may reference images not displayed]

FINDINGS: COMBINED FINDINGS FOR BOTH MR ABDOMEN AND PELVIS

Lower chest: No acute findings.

Hepatobiliary: No suspicious hepatic lesion on this noncontrast
examination. Gallbladder is unremarkable. No biliary ductal
dilation.

Pancreas: No pancreatic ductal dilation or evidence of acute
inflammation.

Spleen:  Normal size spleen without suspicious splenic lesion.

Adrenals/Urinary Tract: Bilateral adrenal glands appear normal. No
hydronephrosis. Urinary bladder is unremarkable for degree of
distension.

Stomach/Bowel: Stomach is moderately distended without abnormal wall
thickening. No pathologic dilation of small or large bowel. A normal
appendix is visualized on coronal haste image [DATE]. Terminal ileum
appears normal. Moderate rectal stool load.

Vascular/Lymphatic:  Normal caliber

Reproductive: Limited evaluation of the uterus and adnexa revealed
no acute abnormality. Probable physiologic corpus luteum in the left
ovary which requires no follow-up.

Other: Small volume of pelvic free fluid is within physiologic
normal limits. Stranding and nodularity in the posterior gluteal
subcutaneous soft tissues appears similar to prior CT [DATE] and may reflect sequela of subcutaneous injections.

Musculoskeletal: No suspicious bone lesions identified.
IMPRESSION: 1. No acute abnormality in the abdomen or pelvis identified,
specifically no evidence of acute appendicitis.
2. Moderate rectal stool load.
3. Stranding and nodularity in the posterior gluteal subcutaneous
soft tissues appears similar to prior CT [DATE] and may
reflect sequela of subcutaneous injections.

## 2021-07-29 IMAGING — US US OB < 14 WEEKS - US OB TV
1 series · 15 of 28 positions shown · non-contrast
Comparison: None Available.

CLINICAL DATA: Initial evaluation for acute right-sided abdominal
pain, early pregnancy.

EXAM:
OBSTETRIC <14 WK US AND TRANSVAGINAL OB US
TECHNIQUE: Both transabdominal and transvaginal ultrasound examinations were
performed for complete evaluation of the gestation as well as the
maternal uterus, adnexal regions, and pelvic cul-de-sac.
Transvaginal technique was performed to assess early pregnancy.

[Series 1: us ob < 14 weeks - us ob tv · 47 acquisitions, 15 frames shown]
[im 1/47]
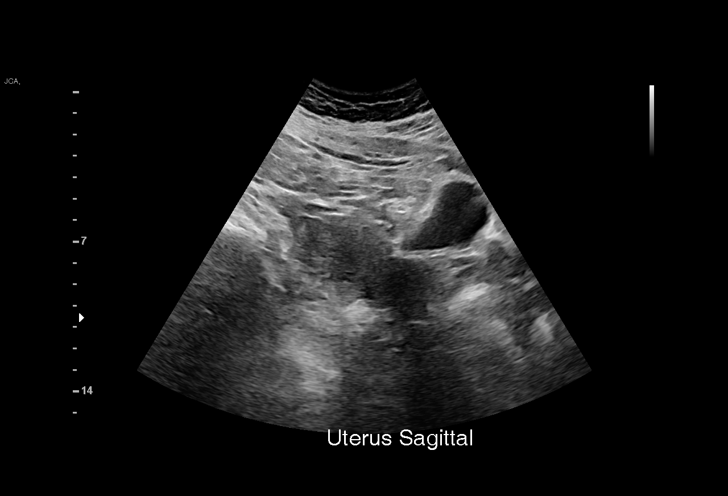
[im 4/47]
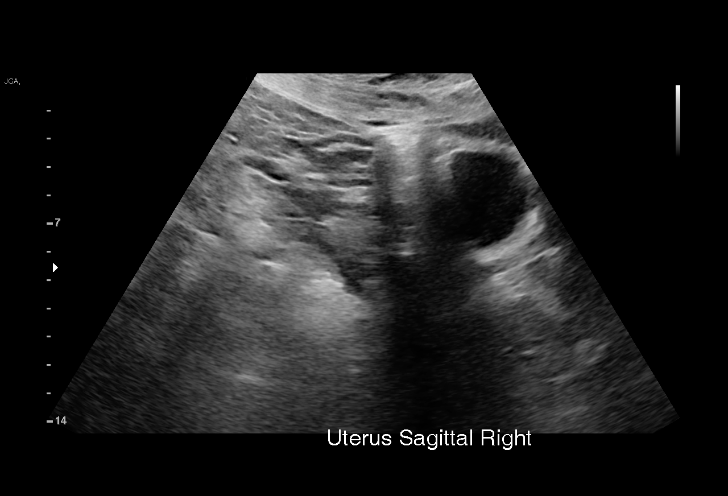
[im 7/47]
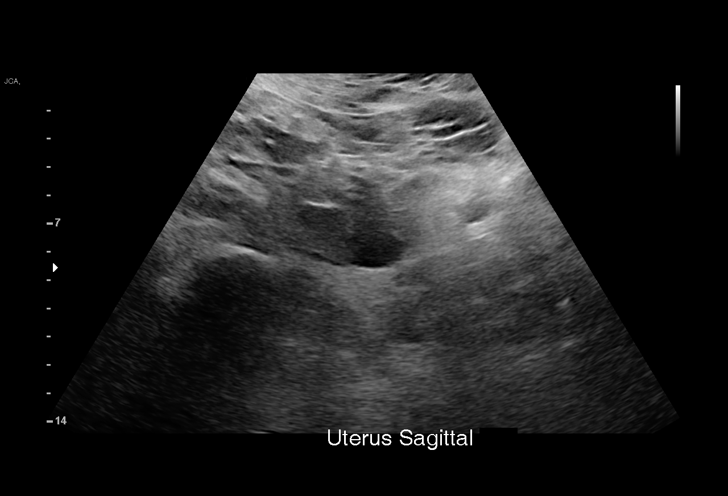
[im 11/47]
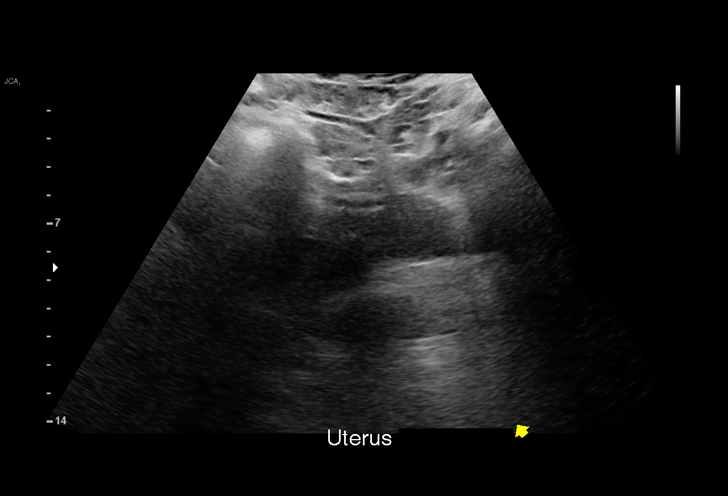
[im 14/47]
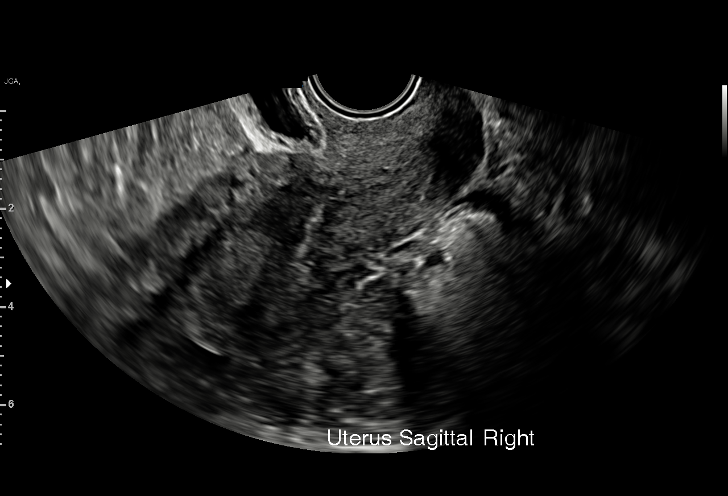
[im 18/47]
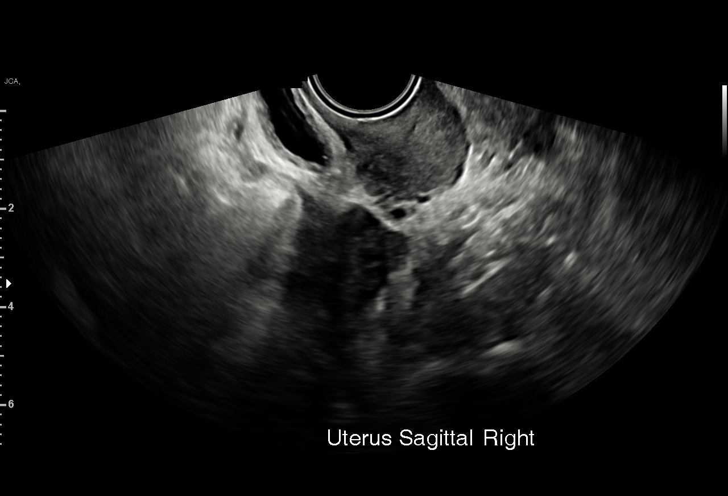
[im 21/47]
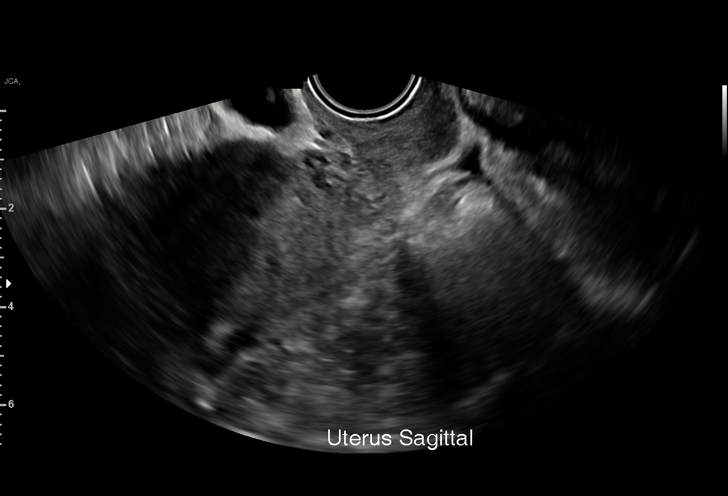
[im 24/47]
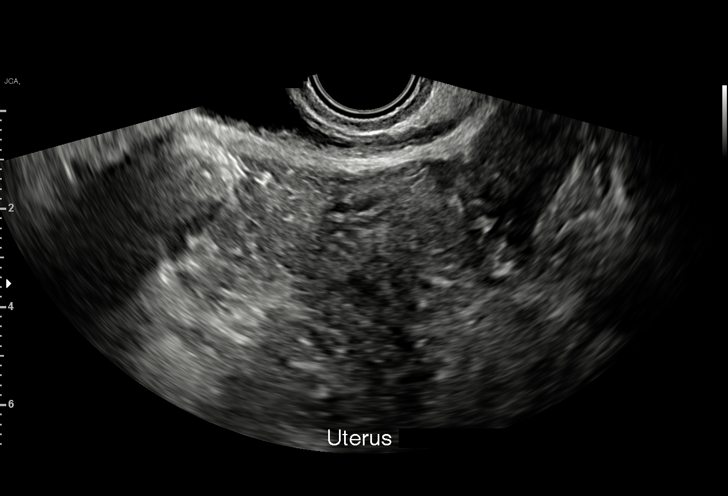
[im 26/47]
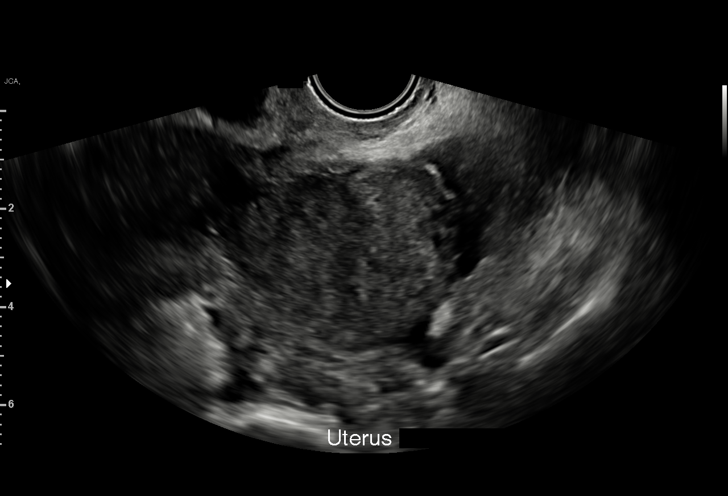
[im 29/47]
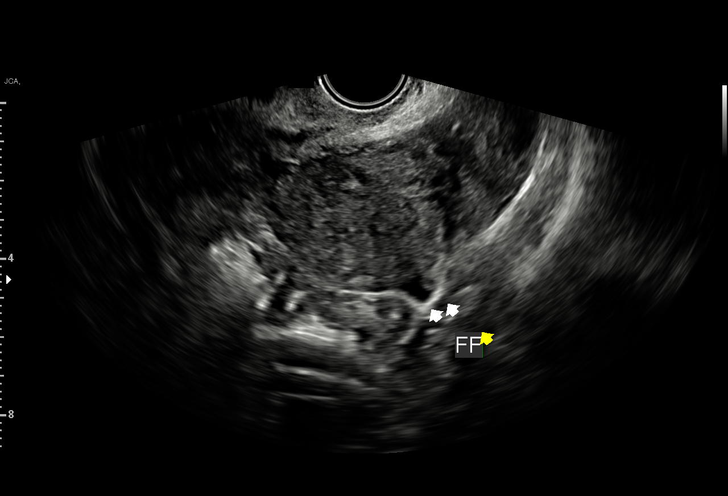
[im 33/47]
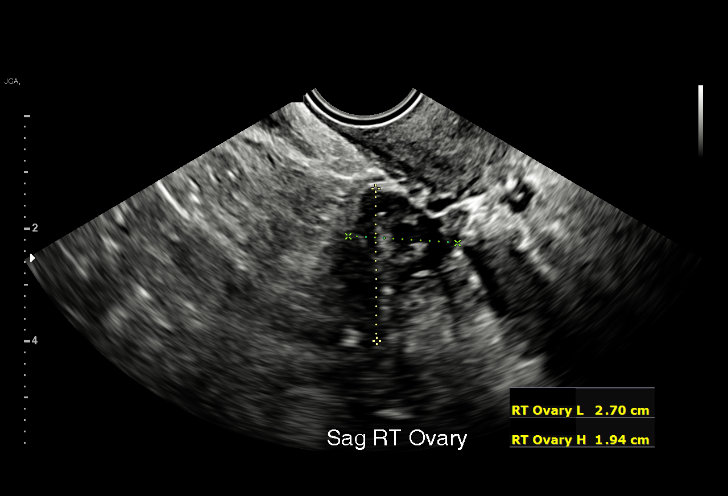
[im 36/47]
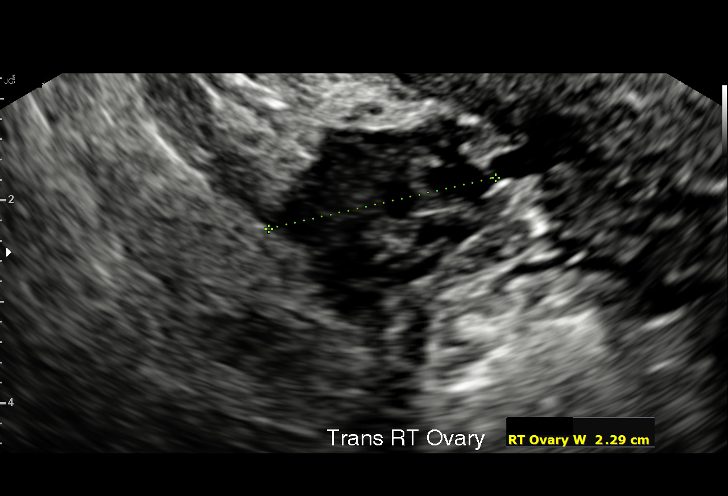
[im 40/47]
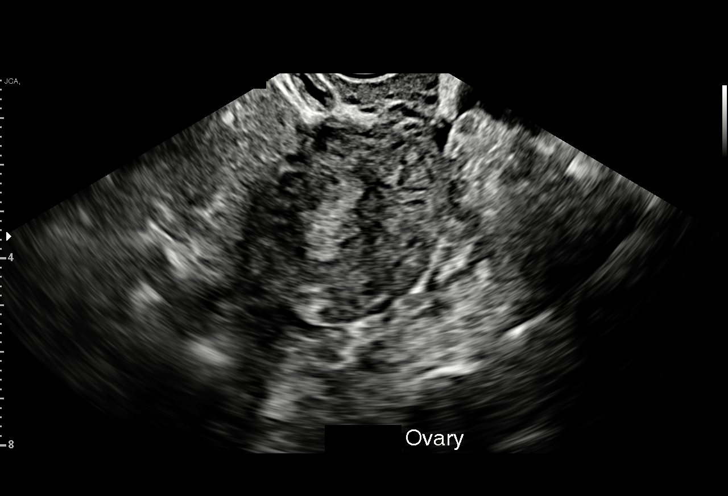
[im 43/47]
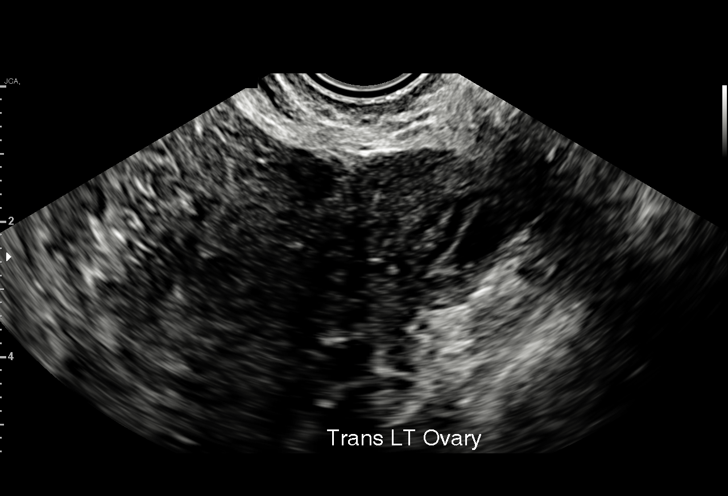
[im 47/47]
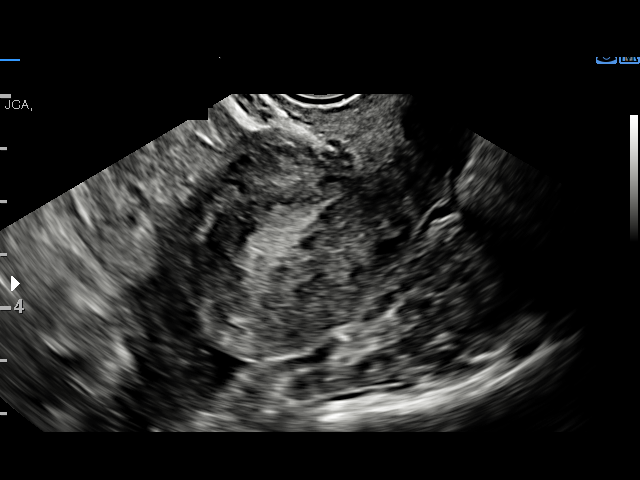

[15 of 28 positions shown; findings below may reference images not displayed]

FINDINGS: Intrauterine gestational sac: None visualized.

Yolk sac:  Negative.

Embryo:  Negative.

Cardiac Activity: Negative.

Heart Rate: N/A

Subchorionic hemorrhage:  None visualized.

Maternal uterus/adnexae: Right ovary within normal limits measuring
2.7 x 1.9 x 2.3 cm. Left ovary within normal limits measuring 3.6 x
2.0 x 2.4 cm. No visible adnexal mass. Small to moderate volume free
fluid seen within the pelvis.
IMPRESSION: 1. Early pregnancy with no discrete IUP or adnexal mass identified.
Finding is consistent with a pregnancy of unknown anatomic location.
Differential considerations include IUP to early to visualize,
recent SAB, or occult ectopic pregnancy. Close clinical monitoring
with serial beta HCGs and close interval follow-up ultrasound
recommended as clinically warranted.
2. Small to moderate volume free fluid within the pelvis.

## 2021-07-29 IMAGING — MR MR ABDOMEN W/O CM
13 of 15 series · 41 of 48 positions shown · non-contrast
Comparison: None Available.

CT [DATE]

CLINICAL DATA: Early pregnancy right lower quadrant pain concern
for appendicitis.

EXAM:
MRI ABDOMEN AND PELVIS WITHOUT CONTRAST
TECHNIQUE: Multiplanar multisequence MR imaging of the abdomen and pelvis was
performed. No intravenous contrast was administered.

[Series 5: cor haste · coronal · 5.0mm · 1.04mm/px · 3 of 34 slices shown]
[im 1/34]
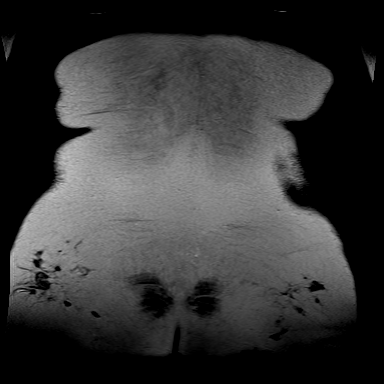
[im 17/34]
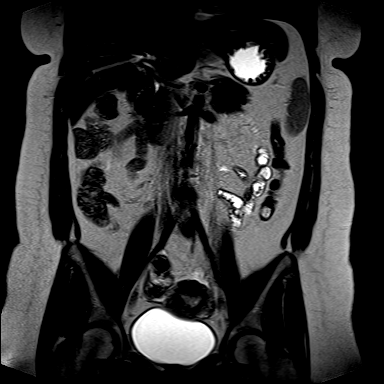
[im 34/34]
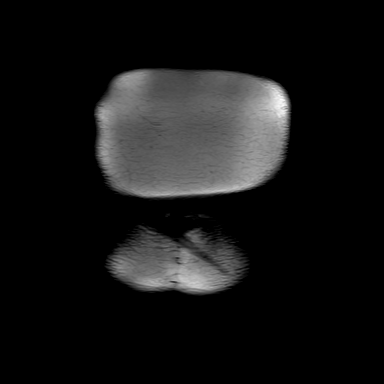

[Series 6: cor haste fs · coronal · 5.0mm · 1.04mm/px · 3 of 34 slices shown]
[im 1/34]
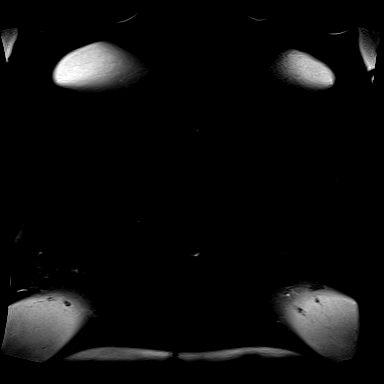
[im 17/34]
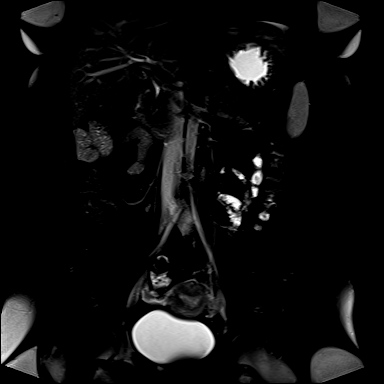
[im 34/34]
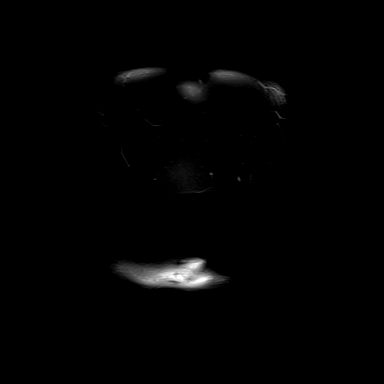

[Series 7: bSSFP · coronal · 5.0mm · 1.61mm/px · 3 of 34 slices shown (1 of 3)]
[im 1/34]
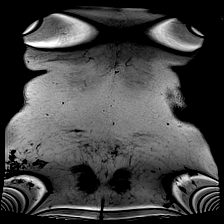
[im 17/34]
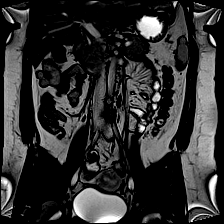
[im 34/34]
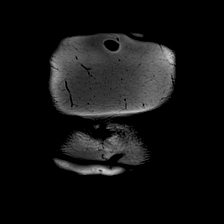

[Series 10: bSSFP · axial · 5.0mm · 0.78mm/px · z∈[-181,+11]mm · 3 of 33 slices shown (2 of 3)]
[im 1/33]
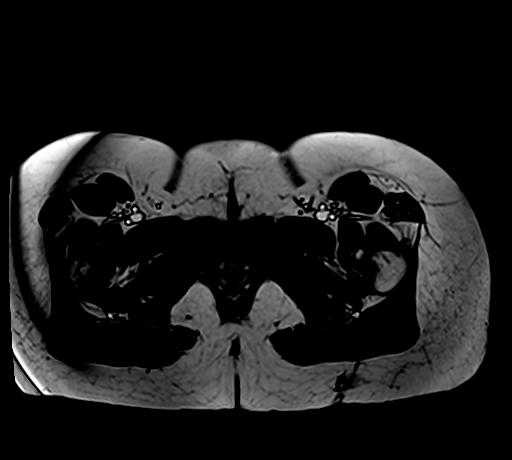
[im 17/33]
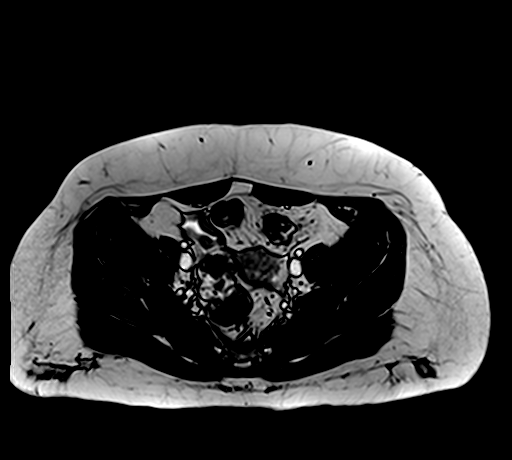
[im 33/33]
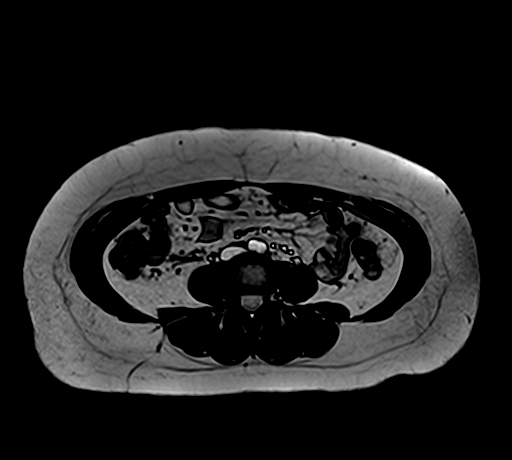

[Series 10: bSSFP · axial · 5.0mm · 1.61mm/px · z∈[+17,+197]mm · 2 of 31 slices shown (3 of 3)]
[im 1/31]
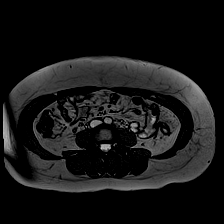
[im 31/31]
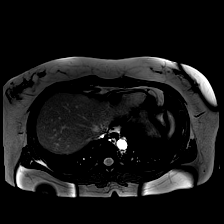

[Series 13: ax haste fs_comp · axial · 5.0mm · 0.99mm/px · z∈[+17,+197]mm · 2 of 31 slices shown (1 of 2)]
[im 1/31]
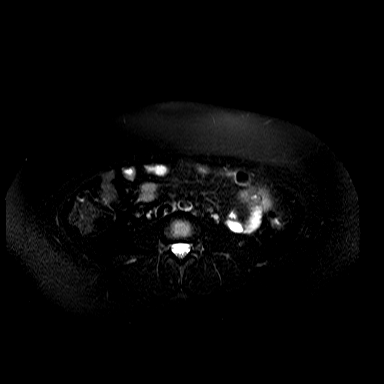
[im 31/31]
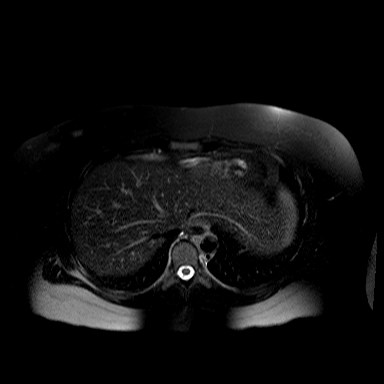

[Series 13: ax haste fs_comp · axial · 5.0mm · 1.19mm/px · z∈[-181,+11]mm · 3 of 33 slices shown (2 of 2)]
[im 1/33]
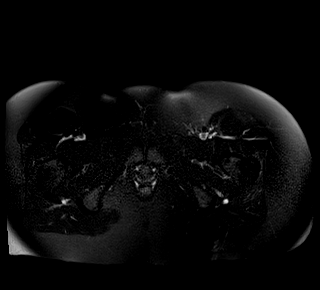
[im 17/33]
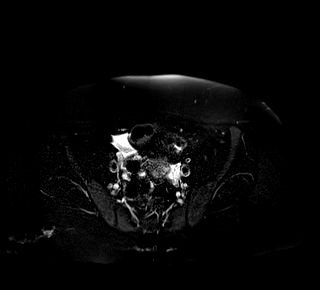
[im 33/33]
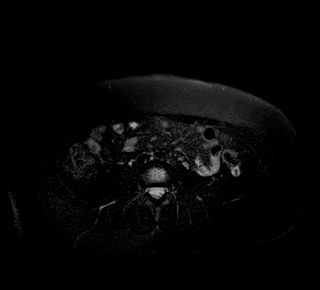

[Series 16: ax haste_comp · axial · 5.0mm · 1.19mm/px · z∈[-181,+11]mm · 3 of 33 slices shown (1 of 2)]
[im 1/33]
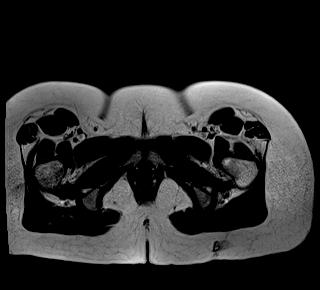
[im 17/33]
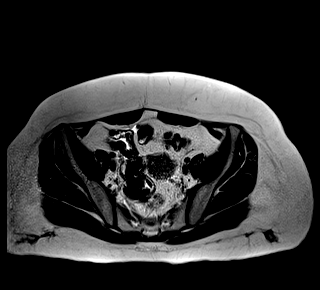
[im 33/33]
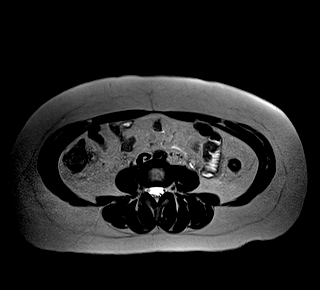

[Series 16: ax haste_comp · axial · 5.0mm · 0.99mm/px · z∈[+17,+197]mm · 2 of 31 slices shown (2 of 2)]
[im 1/31]
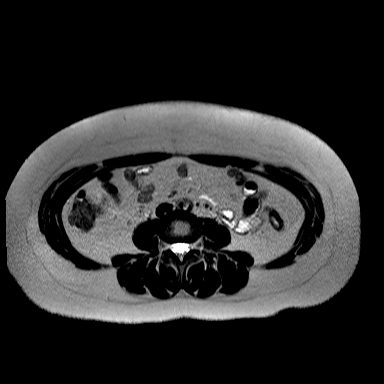
[im 31/31]
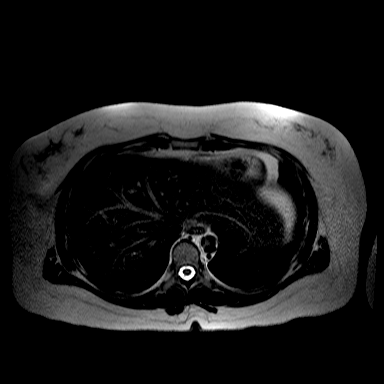

[Series 20: T1 · axial · 5.0mm · 1.48mm/px · z∈[-181,+11]mm · 3 of 33 slices shown (1 of 2)]
[im 1/33]
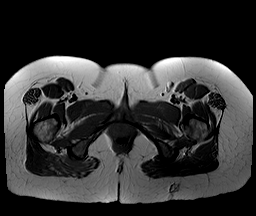
[im 17/33]
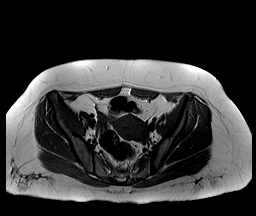
[im 33/33]
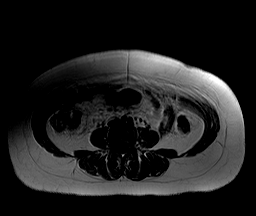

[Series 20: T1 · axial · 5.0mm · 0.70mm/px · z∈[+17,+197]mm · 2 of 31 slices shown (2 of 2)]
[im 1/31]
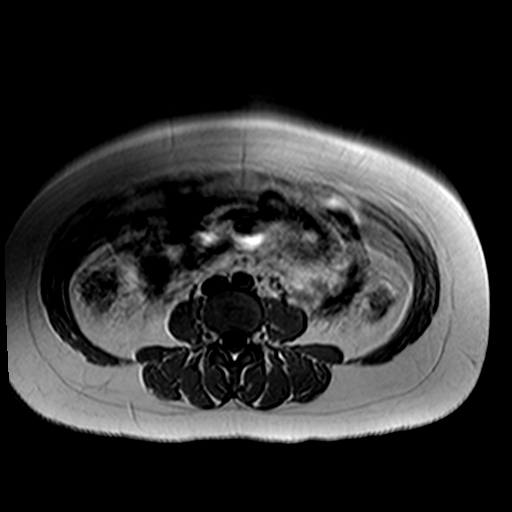
[im 31/31]
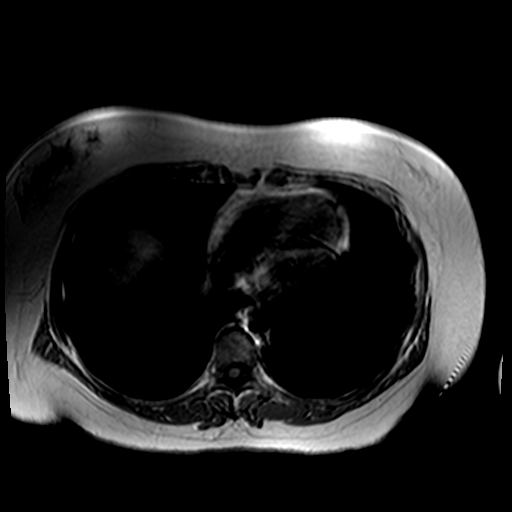

[Series 21: T1 dynamic · axial · 3.0mm · 1.41mm/px · z∈[-14,+223]mm · 6 of 80 slices shown (1 of 2)]
[im 1/80]
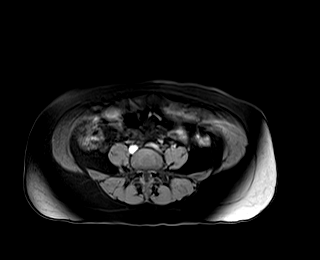
[im 16/80]
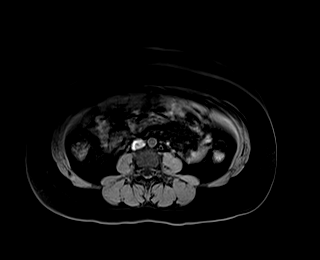
[im 32/80]
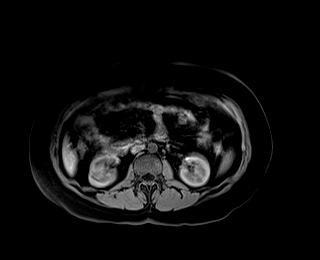
[im 48/80]
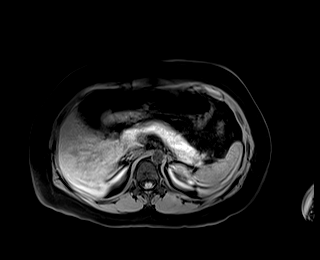
[im 64/80]
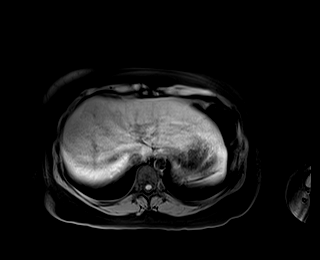
[im 80/80]
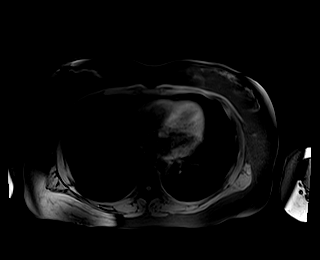

[Series 22: T1 dynamic · axial · 3.0mm · 1.41mm/px · z∈[-203,+34]mm · 6 of 80 slices shown (2 of 2)]
[im 1/80]
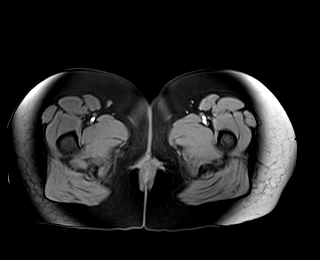
[im 16/80]
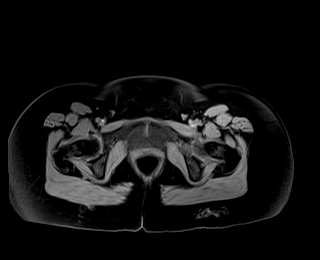
[im 32/80]
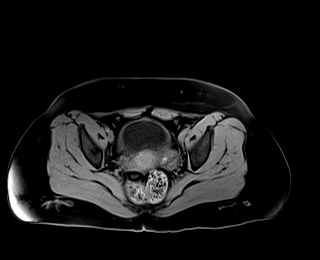
[im 48/80]
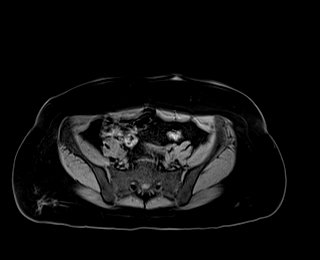
[im 64/80]
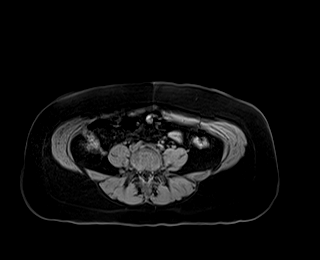
[im 80/80]
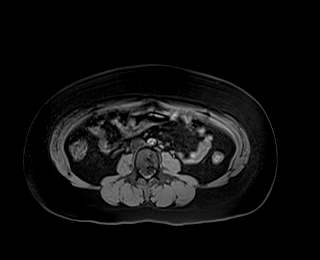

[41 of 48 positions shown; findings below may reference images not displayed]

FINDINGS: COMBINED FINDINGS FOR BOTH MR ABDOMEN AND PELVIS

Lower chest: No acute findings.

Hepatobiliary: No suspicious hepatic lesion on this noncontrast
examination. Gallbladder is unremarkable. No biliary ductal
dilation.

Pancreas: No pancreatic ductal dilation or evidence of acute
inflammation.

Spleen:  Normal size spleen without suspicious splenic lesion.

Adrenals/Urinary Tract: Bilateral adrenal glands appear normal. No
hydronephrosis. Urinary bladder is unremarkable for degree of
distension.

Stomach/Bowel: Stomach is moderately distended without abnormal wall
thickening. No pathologic dilation of small or large bowel. A normal
appendix is visualized on coronal haste image [DATE]. Terminal ileum
appears normal. Moderate rectal stool load.

Vascular/Lymphatic:  Normal caliber

Reproductive: Limited evaluation of the uterus and adnexa revealed
no acute abnormality. Probable physiologic corpus luteum in the left
ovary which requires no follow-up.

Other: Small volume of pelvic free fluid is within physiologic
normal limits. Stranding and nodularity in the posterior gluteal
subcutaneous soft tissues appears similar to prior CT [DATE] and may reflect sequela of subcutaneous injections.

Musculoskeletal: No suspicious bone lesions identified.
IMPRESSION: 1. No acute abnormality in the abdomen or pelvis identified,
specifically no evidence of acute appendicitis.
2. Moderate rectal stool load.
3. Stranding and nodularity in the posterior gluteal subcutaneous
soft tissues appears similar to prior CT [DATE] and may
reflect sequela of subcutaneous injections.

## 2021-07-29 MED ORDER — HYDROMORPHONE HCL 1 MG/ML IJ SOLN
1.0000 mg | Freq: Once | INTRAMUSCULAR | Status: AC
  Administered 2021-07-29: 1 mg via INTRAVENOUS
  Filled 2021-07-29: qty 1

## 2021-07-29 MED ORDER — ONDANSETRON 4 MG PO TBDP: 4.0000 mg | ORAL_TABLET | Freq: Once | ORAL | Status: DC | PRN

## 2021-07-29 MED ORDER — FAMOTIDINE 40 MG PO TABS
40.0000 mg | ORAL_TABLET | Freq: Every day | ORAL | 0 refills | Status: DC
Start: 2021-07-29 — End: 2021-09-23
  Filled 2021-07-29: qty 30, 30d supply, fill #0

## 2021-07-29 MED ORDER — ALUM & MAG HYDROXIDE-SIMETH 200-200-20 MG/5ML PO SUSP
30.0000 mL | Freq: Once | ORAL | Status: AC
  Administered 2021-07-29: 30 mL via ORAL
  Filled 2021-07-29: qty 30

## 2021-07-29 MED ORDER — DICYCLOMINE HCL 10 MG/5ML PO SOLN
10.0000 mg | Freq: Once | ORAL | Status: AC
  Administered 2021-07-29: 10 mg via ORAL
  Filled 2021-07-29: qty 5

## 2021-07-29 MED ORDER — ONDANSETRON HCL 4 MG/2ML IJ SOLN
4.0000 mg | Freq: Once | INTRAMUSCULAR | Status: AC
  Administered 2021-07-29: 4 mg via INTRAVENOUS
  Filled 2021-07-29: qty 2

## 2021-07-29 MED ORDER — CYCLOBENZAPRINE HCL 5 MG PO TABS
10.0000 mg | ORAL_TABLET | Freq: Once | ORAL | Status: AC
  Administered 2021-07-29: 10 mg via ORAL
  Filled 2021-07-29: qty 2

## 2021-07-29 MED ORDER — LIDOCAINE VISCOUS HCL 2 % MT SOLN
15.0000 mL | Freq: Once | OROMUCOSAL | Status: AC
  Administered 2021-07-29: 15 mL via ORAL
  Filled 2021-07-29: qty 15

## 2021-07-29 NOTE — MAU Note (Signed)
Called MRI to inform that patient is ready for transport. MRI stated it would be at least one more hour. ?

## 2021-07-29 NOTE — ED Triage Notes (Addendum)
Pt presents for umbilical region abd pain that started 12pm, getting worse and more frequent. Endorses boating, cramping, N/V/D. ?Denies urinary sx.  ?Abd surgeries: c-section ?LMP 4/1 ? ?

## 2021-07-29 NOTE — MAU Provider Note (Addendum)
Chief Complaint: Abdominal Pain ? ? Event Date/Time  ? First Provider Initiated Contact with Patient 07/29/21 0525   ?  ?   ?SUBJECTIVE ?HPI: Kimberly Poole is a 36 y.o. G2P1001 at [redacted]w[redacted]d by LMP who presents from [facility] ED to maternity admissions reporting severe periumbilical pain since noon.  Patient is doubled over on floor of [place] room.  We gave her a dose of Dilaudid IV to facilitate [place].Marland Kitchen ?She denies vaginal bleeding, vaginal itching/burning, urinary symptoms, h/a, dizziness or fever/chills.  ?  ?Abdominal Pain ?This is a new problem. The current episode started yesterday. The problem occurs constantly. The problem has been unchanged. The pain is located in the periumbilical region. The pain is at a severity of 10/10. The pain is severe. The quality of the pain is cramping, colicky and sharp. The abdominal pain does not radiate. Associated symptoms include nausea and vomiting. Pertinent negatives include no constipation, diarrhea, dysuria, fever, frequency, headaches or myalgias. The pain is aggravated by certain positions, movement and palpation. The pain is relieved by Nothing. She has tried nothing for the symptoms.  ? ?RN Note: ?Pt presents for umbilical region abd pain that started 12pm, getting worse and more frequent. Endorses boating, cramping, N/V/D. ?Denies urinary sx.  ?Abd surgeries: c-section ?LMP 4/1 ? ?Past Medical History:  ?Diagnosis Date  ? Anxiety   ? Depression   ? Gallstones   ? Hypotension   ? ?Past Surgical History:  ?Procedure Laterality Date  ? CESAREAN SECTION    ? ?Social History  ? ?Socioeconomic History  ? Marital status: Married  ?  Spouse name: Not on file  ? Number of children: Not on file  ? Years of education: Not on file  ? Highest education level: Not on file  ?Occupational History  ? Not on file  ?Tobacco Use  ? Smoking status: Never  ? Smokeless tobacco: Never  ?Substance and Sexual Activity  ? Alcohol use: Yes  ?  Comment: celebrations  ? Drug use: Not  Currently  ? Sexual activity: Yes  ?Other Topics Concern  ? Not on file  ?Social History Narrative  ? ** Merged History Encounter **  ?    ? ?Social Determinants of Health  ? ?Financial Resource Strain: Not on file  ?Food Insecurity: Not on file  ?Transportation Needs: Not on file  ?Physical Activity: Not on file  ?Stress: Not on file  ?Social Connections: Not on file  ?Intimate Partner Violence: Not on file  ? ?No current facility-administered medications on file prior to encounter.  ? ?Current Outpatient Medications on File Prior to Encounter  ?Medication Sig Dispense Refill  ? citalopram (CELEXA) 20 MG tablet Take 20 mg by mouth at bedtime.    ? hydrOXYzine (ATARAX) 50 MG tablet Take 50 mg by mouth at bedtime.    ? acetaminophen (TYLENOL) 500 MG tablet Take 500 mg by mouth every 6 (six) hours as needed for moderate pain, fever or headache.    ? B Complex Vitamins (VITAMIN B COMPLEX) TABS Take 1 tablet by mouth daily.    ? cephALEXin (KEFLEX) 500 MG capsule Take 1 capsule (500 mg total) by mouth 2 (two) times daily. 28 capsule 0  ? HYDROcodone-acetaminophen (NORCO/VICODIN) 5-325 MG tablet Take 1 tablet by mouth every 4 (four) hours as needed. 12 tablet 0  ? ibuprofen (ADVIL) 800 MG tablet Take 1 tablet (800 mg total) by mouth 3 (three) times daily. 21 tablet 0  ? ibuprofen (ADVIL) 800 MG tablet Take  1 tablet (800 mg total) by mouth 3 (three) times daily. 21 tablet 0  ? oxyCODONE-acetaminophen (PERCOCET/ROXICET) 5-325 MG tablet Take 1 tablet by mouth every 4 (four) hours as needed for severe pain. 15 tablet 0  ? phenazopyridine (PYRIDIUM) 95 MG tablet Take 1 tablet (95 mg total) by mouth 3 (three) times daily as needed for pain. 10 tablet 0  ? promethazine (PHENERGAN) 25 MG tablet Take 1 tablet (25 mg total) by mouth every 6 (six) hours as needed for nausea or vomiting. 12 tablet 0  ? tamsulosin (FLOMAX) 0.4 MG CAPS capsule Take 1 capsule (0.4 mg total) by mouth daily. 10 capsule 0  ? ?Allergies  ?Allergen  Reactions  ? Diclofenac Itching  ? ? ?I have reviewed patient's Past Medical Hx, Surgical Hx, Family Hx, Social Hx, medications and allergies.  ? ?ROS:  ?Review of Systems  ?Constitutional:  Negative for fever.  ?Gastrointestinal:  Positive for abdominal pain, nausea and vomiting. Negative for constipation and diarrhea.  ?Genitourinary:  Negative for dysuria and frequency.  ?Musculoskeletal:  Negative for myalgias.  ?Neurological:  Negative for headaches.  ?Review of Systems  ?Other systems negative ? ? ?Physical Exam  ?Physical Exam ?Patient Vitals for the past 24 hrs: ? BP Temp Temp src Pulse Resp SpO2 Height Weight  ?07/29/21 1409 98/66 98.4 ?F (36.9 ?C) Oral 68 14 98 % -- --  ?07/29/21 1126 103/80 98.6 ?F (37 ?C) Oral 78 20 99 % -- --  ?07/29/21 0759 98/68 98.6 ?F (37 ?C) Oral 78 15 99 % -- --  ?07/29/21 0635 110/76 -- -- 80 18 -- -- --  ?07/29/21 0527 99/71 98 ?F (36.7 ?C) Oral 98 (!) 24 100 % 5\' 1"  (1.549 m) 73.4 kg  ?07/29/21 0438 116/75 -- -- 98 -- -- -- --  ?07/29/21 0429 95/80 98.3 ?F (36.8 ?C) Oral 97 (!) 22 98 % -- --  ?07/29/21 0429 -- -- -- -- -- -- -- 68 kg  ? ?Constitutional: Well-developed, well-nourished female doubled over in pain.09/28/21  ?Cardiovascular: normal rate ?Respiratory: normal effort ?GI: Abd soft, Very tender over abdomen, esp periumbilical ?MS: Extremities nontender, no edema, normal ROM ?Neurologic: Alert and oriented x 4.  ?GU: Neg CVAT. ? ?PELVIC EXAM: Exam deferred.  TV Marland Kitchen produced exquisite pain in adnexal regions bilaterally ? ?LAB RESULTS ?Results for orders placed or performed during the hospital encounter of 07/29/21 (from the past 24 hour(s))  ?Lipase, blood     Status: None  ? Collection Time: 07/29/21  4:40 AM  ?Result Value Ref Range  ? Lipase 30 11 - 51 U/L  ?Comprehensive metabolic panel     Status: Abnormal  ? Collection Time: 07/29/21  4:40 AM  ?Result Value Ref Range  ? Sodium 136 135 - 145 mmol/L  ? Potassium 3.8 3.5 - 5.1 mmol/L  ? Chloride 107 98 - 111 mmol/L  ? CO2 17  (L) 22 - 32 mmol/L  ? Glucose, Bld 112 (H) 70 - 99 mg/dL  ? BUN 12 6 - 20 mg/dL  ? Creatinine, Ser 0.87 0.44 - 1.00 mg/dL  ? Calcium 9.2 8.9 - 10.3 mg/dL  ? Total Protein 7.4 6.5 - 8.1 g/dL  ? Albumin 4.2 3.5 - 5.0 g/dL  ? AST 22 15 - 41 U/L  ? ALT 21 0 - 44 U/L  ? Alkaline Phosphatase 82 38 - 126 U/L  ? Total Bilirubin 0.8 0.3 - 1.2 mg/dL  ? GFR, Estimated >60 >60 mL/min  ? Anion gap 12  5 - 15  ?CBC     Status: None  ? Collection Time: 07/29/21  4:40 AM  ?Result Value Ref Range  ? WBC 10.5 4.0 - 10.5 K/uL  ? RBC 4.52 3.87 - 5.11 MIL/uL  ? Hemoglobin 13.6 12.0 - 15.0 g/dL  ? HCT 38.7 36.0 - 46.0 %  ? MCV 85.6 80.0 - 100.0 fL  ? MCH 30.1 26.0 - 34.0 pg  ? MCHC 35.1 30.0 - 36.0 g/dL  ? RDW 11.9 11.5 - 15.5 %  ? Platelets 238 150 - 400 K/uL  ? nRBC 0.0 0.0 - 0.2 %  ?I-Stat beta hCG blood, ED     Status: Abnormal  ? Collection Time: 07/29/21  4:48 AM  ?Result Value Ref Range  ? I-stat hCG, quantitative 88.5 (H) <5 mIU/mL  ? Comment 3          ?hCG, quantitative, pregnancy     Status: Abnormal  ? Collection Time: 07/29/21  5:20 AM  ?Result Value Ref Range  ? hCG, Beta Chain, Quant, S 99 (H) <5 mIU/mL  ?Wet prep, genital     Status: Abnormal  ? Collection Time: 07/29/21  6:04 AM  ?Result Value Ref Range  ? Yeast Wet Prep HPF POC NONE SEEN NONE SEEN  ? Trich, Wet Prep NONE SEEN NONE SEEN  ? Clue Cells Wet Prep HPF POC PRESENT (A) NONE SEEN  ? WBC, Wet Prep HPF POC >=10 (A) <10  ? Sperm NONE SEEN   ?Urinalysis, Routine w reflex microscopic Urine, Clean Catch     Status: Abnormal  ? Collection Time: 07/29/21 11:26 AM  ?Result Value Ref Range  ? Color, Urine YELLOW YELLOW  ? APPearance HAZY (A) CLEAR  ? Specific Gravity, Urine 1.023 1.005 - 1.030  ? pH 6.0 5.0 - 8.0  ? Glucose, UA NEGATIVE NEGATIVE mg/dL  ? Hgb urine dipstick NEGATIVE NEGATIVE  ? Bilirubin Urine NEGATIVE NEGATIVE  ? Ketones, ur 80 (A) NEGATIVE mg/dL  ? Protein, ur NEGATIVE NEGATIVE mg/dL  ? Nitrite NEGATIVE NEGATIVE  ? Leukocytes,Ua NEGATIVE NEGATIVE  ?CBC  with Differential/Platelet     Status: Abnormal  ? Collection Time: 07/29/21 12:33 PM  ?Result Value Ref Range  ? WBC 9.1 4.0 - 10.5 K/uL  ? RBC 3.95 3.87 - 5.11 MIL/uL  ? Hemoglobin 12.2 12.0 - 15.0 g/dL  ? HCT 34.4

## 2021-07-29 NOTE — ED Notes (Addendum)
Called Dr. Mayford Knife at [facility], pt accepted in MAU for ectopic work up. Report given to [place], Charity fundraiser.  ?

## 2021-07-29 NOTE — ED Notes (Signed)
No relief from standing order zofran IV ?

## 2021-07-29 NOTE — MAU Note (Signed)
Pt says with interpreter- abd pain started x1 week.  ?Has difficulty urinating - painful ? ?

## 2021-07-29 NOTE — MAU Note (Signed)
Patient taken directly from triage to [place] due to patient condition. ? ?At approximately 90, [place]  tech notified Nachum Derossett, RN that patient was unable to tolerate US exam due to pain. RN and Williams,CNM to [place] room. RN entered room to find patient on the floor grasping her abdomen and crying. [place] tech informed RN that patient got down on the floor due to their pain. Patient assisted back into exam chair (2 person assist). CNM placed order for IV pain medication. RN and CNM remained at [place] bedside for remainder of exam. After medication administration, patient was notably more comfortable and able to finish [place]. Once back in room RN finished asking patient check-in questions and patient verbalized that her pain was 0/10.  ?

## 2021-07-29 NOTE — Discharge Instructions (Signed)

## 2021-07-29 NOTE — MAU Note (Signed)
Transport arrived to take patient to MRI

## 2021-07-31 ENCOUNTER — Inpatient Hospital Stay (HOSPITAL_COMMUNITY)
Admission: AD | Admit: 2021-07-31 | Discharge: 2021-07-31 | Disposition: A | Discharge status: Home / Self Care | Payer: Self-pay | Attending: Obstetrics & Gynecology | Admitting: Obstetrics & Gynecology

## 2021-07-31 ENCOUNTER — Inpatient Hospital Stay (HOSPITAL_COMMUNITY)
Admit: 2021-07-31 | Discharge: 2021-07-31 | Disposition: A | Discharge status: Home / Self Care | Payer: Self-pay | Attending: Obstetrics & Gynecology | Admitting: Obstetrics & Gynecology

## 2021-07-31 DIAGNOSIS — O09519 Supervision of elderly primigravida, unspecified trimester: Secondary | ICD-10-CM | POA: Insufficient documentation

## 2021-07-31 DIAGNOSIS — O3680X0 Pregnancy with inconclusive fetal viability, not applicable or unspecified: Secondary | ICD-10-CM | POA: Insufficient documentation

## 2021-07-31 LAB — HCG, QUANTITATIVE, PREGNANCY: hCG, Beta Chain, Quant, S: 364 m[IU]/mL — ABNORMAL HIGH (ref <5)

## 2021-07-31 NOTE — MAU Note (Signed)
Pt reports to mau for follow up hcg labs.  Denies abd. pain or bleeding today ?

## 2021-07-31 NOTE — MAU Provider Note (Signed)
Event Date/Time  ? First Provider Initiated Contact with Patient 07/31/21 1150   ?  ? ?S ?Kimberly Poole [name] is a 36 y.o. G2P1001 patient who presents to MAU today for repeat stat Quant hCG. She is without complaints.  ? ?O ?BP 108/76 (BP Location: Right Arm)   Pulse 75   Temp 98.6 ?F (37 ?C) (Oral)   Resp 15   LMP 06/18/2021   SpO2 100%  ? ?Physical Exam ?Vitals and nursing note reviewed. Exam conducted with a chaperone present.  ?Constitutional:   ?   Appearance: Normal appearance.  ?Cardiovascular:  ?   Rate and Rhythm: Normal rate.  ?Pulmonary:  ?   Effort: Pulmonary effort is normal.  ?Neurological:  ?   Mental Status: She is alert and oriented to person, place, and time.  ?Psychiatric:     ?   Mood and Affect: Mood normal.     ?   Behavior: Behavior normal.     ?   Thought Content: Thought content normal.     ?   Judgment: Judgment normal.  ? ? ?A ?Medical screening exam complete ?Language barrier: in-person spanish language interpreter present for all patient interaction ?Apporopriate rise in quant hCG: ?Component ?    Latest Ref Rng 07/29/2021 07/31/2021  ?HCG, Beta Chain, Quant, S ?    <5 mIU/mL 99 (H)  364 (H)   ?  ? ?P ?Discharge from MAU in stable condition with ectopic precautions ? ?F/U ?Order placed for repeat ultrasound in 10-14 days at [facility] ? ? ?Calvert Cantor, [place] ?07/31/2021 3:18 PM  ? ?

## 2021-08-01 LAB — GC/CHLAMYDIA PROBE AMP (~~LOC~~) NOT AT ARMC
Chlamydia: NEGATIVE
Comment: NEGATIVE
Comment: NORMAL
Neisseria Gonorrhea: NEGATIVE

## 2021-08-14 ENCOUNTER — Other Ambulatory Visit: Payer: Self-pay

## 2021-08-14 ENCOUNTER — Inpatient Hospital Stay (HOSPITAL_COMMUNITY)
Admission: AD | Admit: 2021-08-14 | Discharge: 2021-08-14 | Disposition: A | Discharge status: Home / Self Care | Payer: Self-pay | Attending: Family Medicine | Admitting: Family Medicine

## 2021-08-14 ENCOUNTER — Inpatient Hospital Stay (HOSPITAL_COMMUNITY): Payer: Self-pay

## 2021-08-14 DIAGNOSIS — Z3490 Encounter for supervision of normal pregnancy, unspecified, unspecified trimester: Secondary | ICD-10-CM

## 2021-08-14 DIAGNOSIS — R1030 Lower abdominal pain, unspecified: Secondary | ICD-10-CM | POA: Insufficient documentation

## 2021-08-14 DIAGNOSIS — O26891 Other specified pregnancy related conditions, first trimester: Secondary | ICD-10-CM | POA: Insufficient documentation

## 2021-08-14 DIAGNOSIS — R519 Headache, unspecified: Secondary | ICD-10-CM | POA: Insufficient documentation

## 2021-08-14 DIAGNOSIS — Z3A01 Less than 8 weeks gestation of pregnancy: Secondary | ICD-10-CM | POA: Insufficient documentation

## 2021-08-14 LAB — URINALYSIS, ROUTINE W REFLEX MICROSCOPIC
Bilirubin Urine: NEGATIVE
Glucose, UA: NEGATIVE mg/dL
Hgb urine dipstick: NEGATIVE
Ketones, ur: NEGATIVE mg/dL
Leukocytes,Ua: NEGATIVE
Nitrite: NEGATIVE
Protein, ur: NEGATIVE mg/dL
Specific Gravity, Urine: 1.017 (ref 1.005–1.030)
pH: 7 (ref 5.0–8.0)

## 2021-08-14 LAB — WET PREP, GENITAL
Clue Cells Wet Prep HPF POC: NONE SEEN
Sperm: NONE SEEN
Trich, Wet Prep: NONE SEEN
WBC, Wet Prep HPF POC: >=10 — AB (ref <10)
Yeast Wet Prep HPF POC: NONE SEEN

## 2021-08-14 IMAGING — US US OB TRANSVAGINAL
1 series · 15 of 28 positions shown · non-contrast
Comparison: None Available.

CLINICAL DATA: Lower abdominal pain.  Positive pregnancy test.

EXAM:
TRANSVAGINAL OB ULTRASOUND
TECHNIQUE: Transvaginal ultrasound was performed for complete evaluation of the
gestation as well as the maternal uterus, adnexal regions, and
pelvic cul-de-sac.

[Series 1: us ob transvaginal · 15 of 33 slices shown]
[im 1/33]
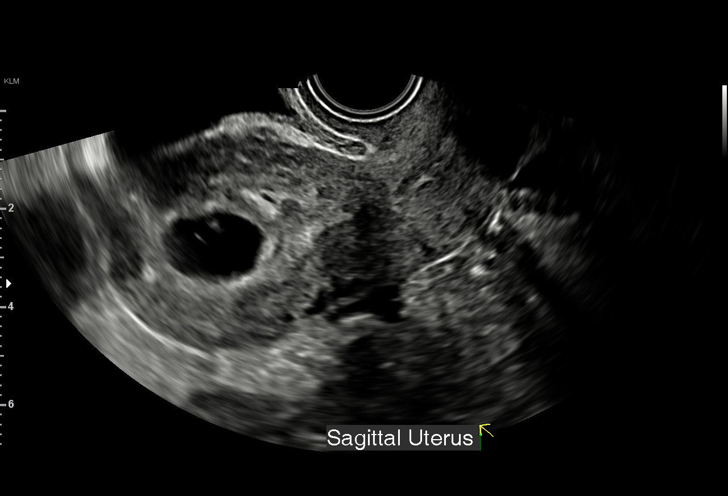
[im 3/33]
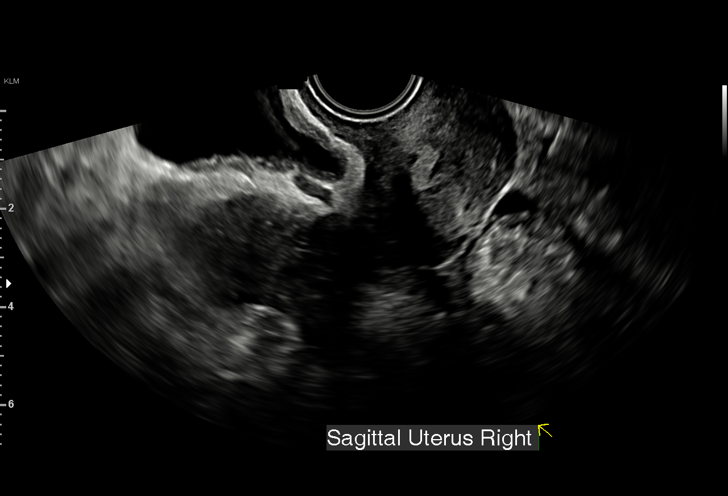
[im 5/33]
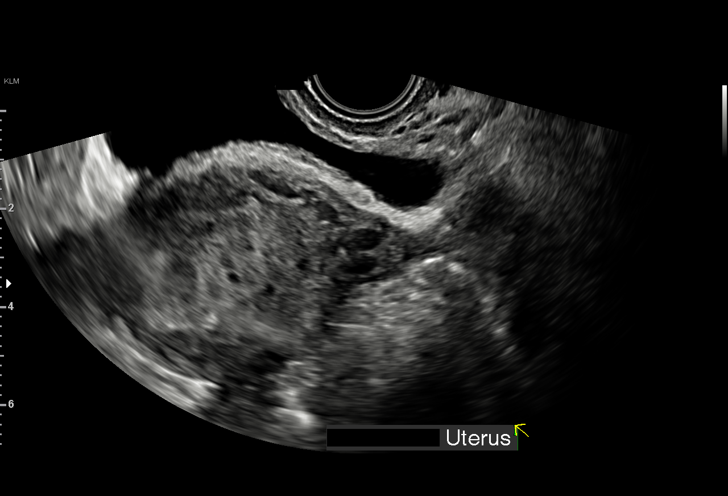
[im 8/33]
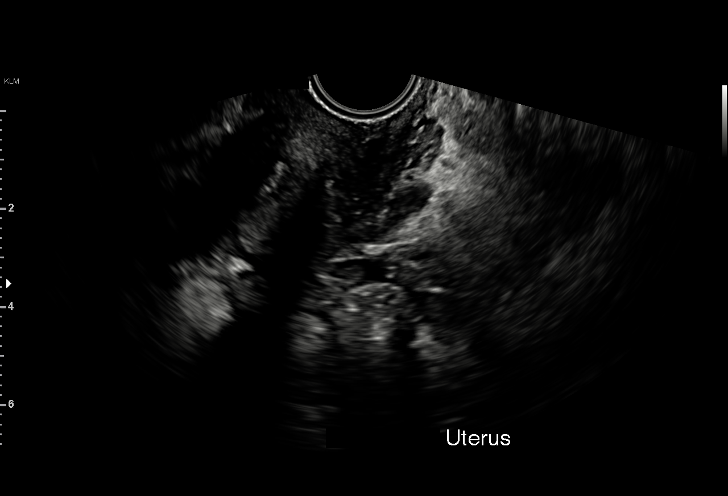
[im 10/33]
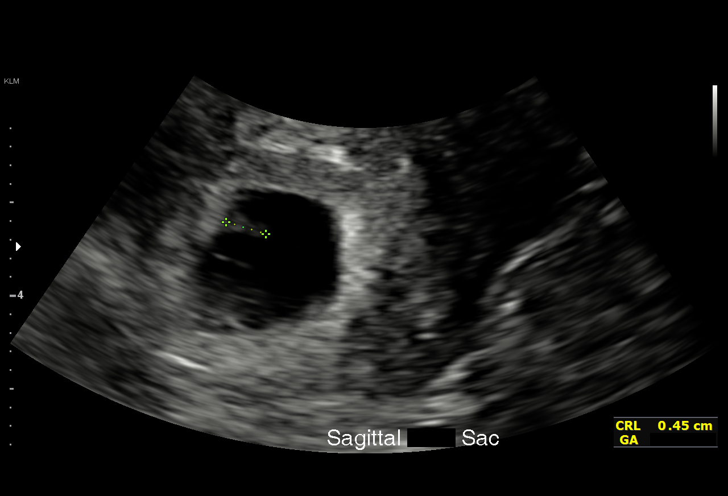
[im 12/33]
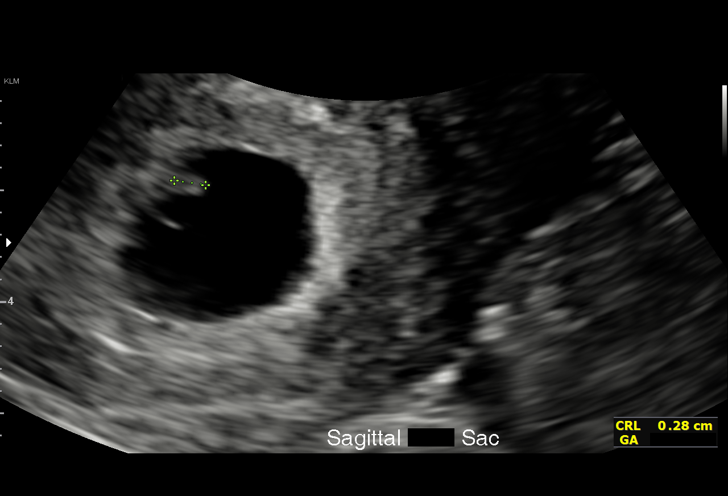
[im 15/33]
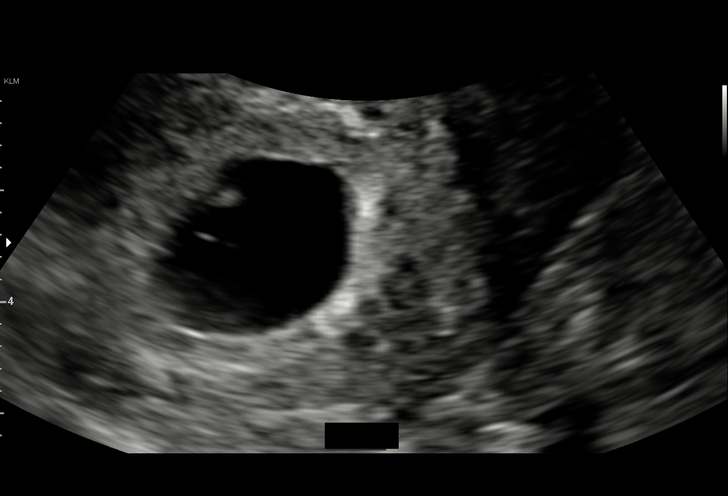
[im 17/33]
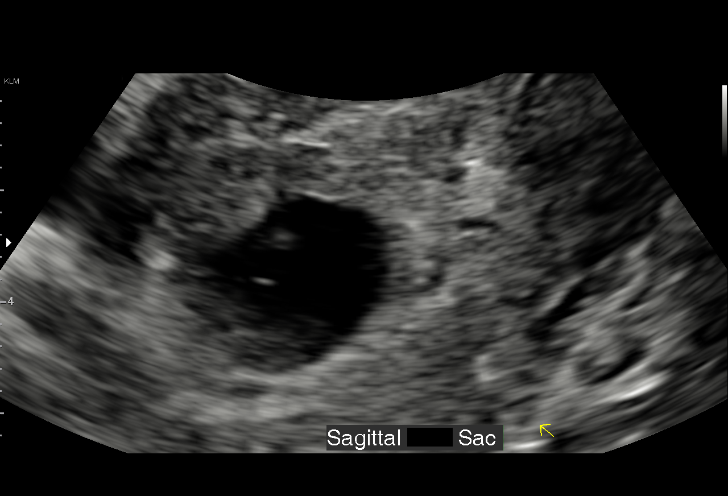
[im 18/33]
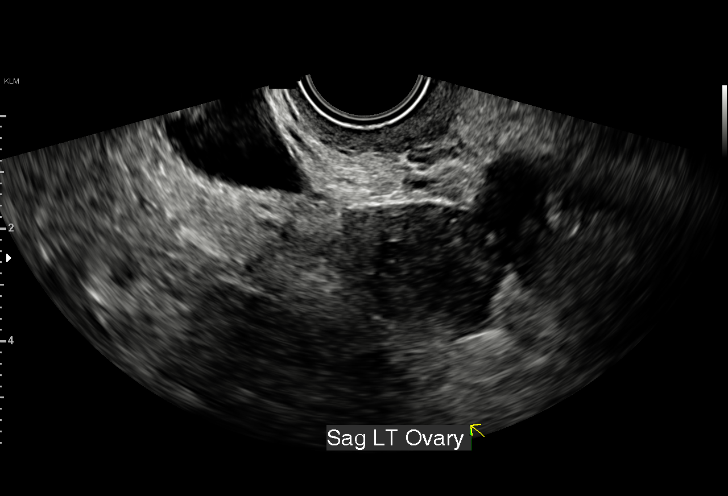
[im 21/33]
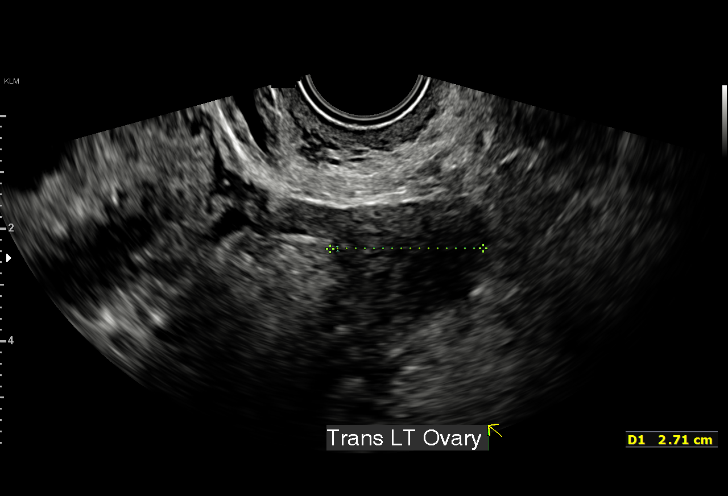
[im 23/33]
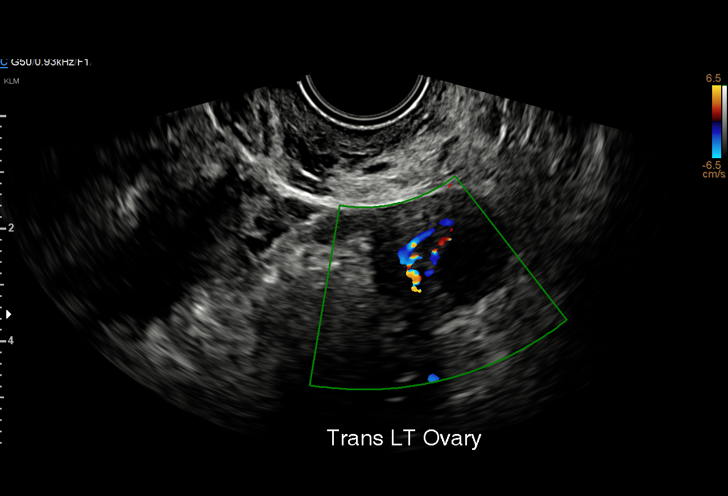
[im 25/33]
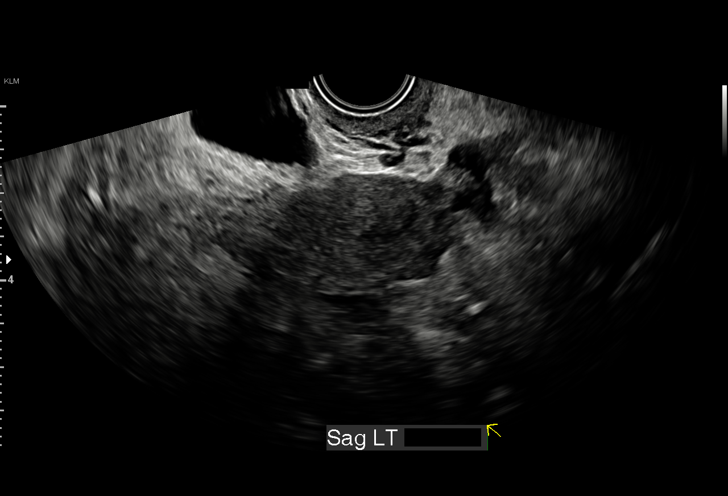
[im 28/33]
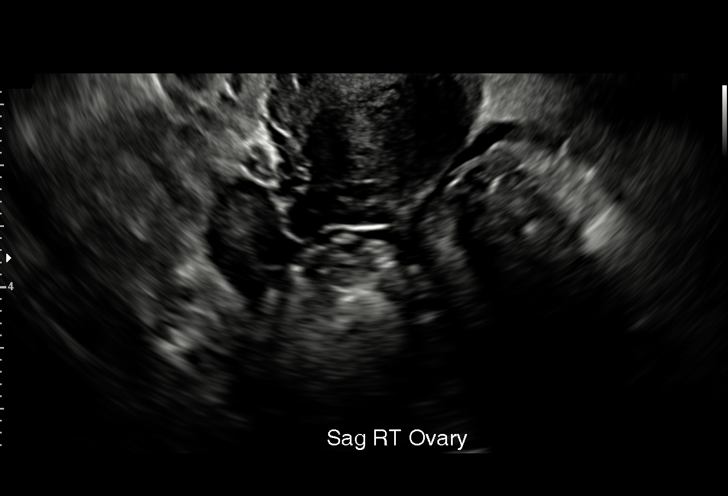
[im 30/33]
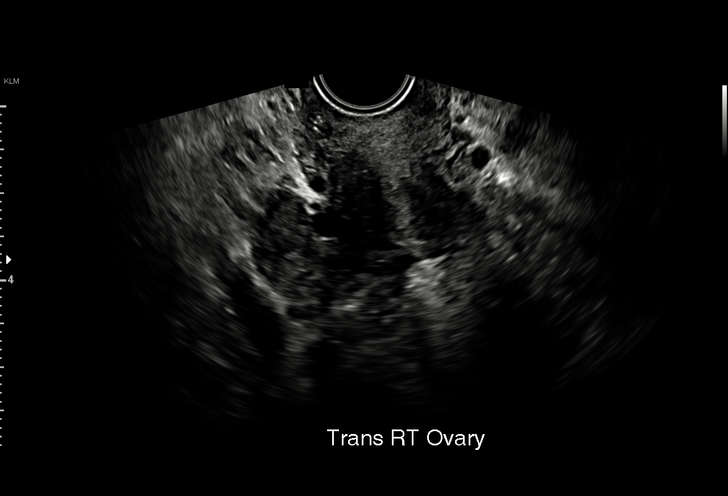
[im 33/33]
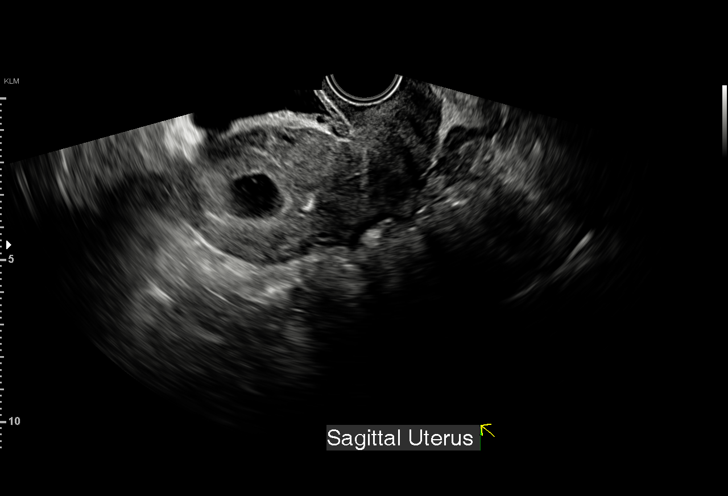

[15 of 28 positions shown; findings below may reference images not displayed]

FINDINGS: Intrauterine gestational sac: Single

Yolk sac:  Visualized

Embryo:  Visualize

Cardiac Activity: Visualize

Heart Rate: 104 bpm

CRL:   3.3 mm   5 w 6 d                  US EDC: [DATE]

Subchorionic hemorrhage:  None visualized.

Maternal uterus/adnexae: Right ovary measures 1.3 x 2.4 x 1.5 cm,
unremarkable. Left ovary measures 2.7 x 3.0 x 2.2 cm, unremarkable.
No free fluid in the cul-de-sac.
IMPRESSION: Single living intrauterine gestation at estimated 5 week 6 day
gestational age by crown-rump length.

## 2021-08-14 MED ORDER — PREPLUS 27-1 MG PO TABS: 1.0000 | ORAL_TABLET | Freq: Every day | ORAL | 13 refills | Status: DC

## 2021-08-14 MED ORDER — ACETAMINOPHEN-CAFFEINE 500-65 MG PO TABS
2.0000 | ORAL_TABLET | Freq: Once | ORAL | Status: AC
  Administered 2021-08-14: 2 via ORAL
  Filled 2021-08-14: qty 2

## 2021-08-14 NOTE — MAU Note (Signed)
Kimberly Poole Kimberly Poole is a 36 y.o. at [redacted]w[redacted]d here in MAU reporting: states she was told to come here for an u/s. Denies bleeding. Does have a headache and lower abdominal pain.   Onset of complaint: ongoing  Pain score: headache 8/10, abdomen 7/10  Vitals:   08/14/21 1035  BP: 106/69  Pulse: 75  Resp: 16  Temp: 98.3 F (36.8 C)  SpO2: 99%     Lab orders placed from triage: UA

## 2021-08-14 NOTE — Discharge Instructions (Signed)

## 2021-08-14 NOTE — MAU Provider Note (Signed)
History     CSN: [id number]  Arrival date and time: 08/14/21 1024   Event Date/Time   First Provider Initiated Contact with Patient 08/14/21 1047      Chief Complaint  Patient presents with   Headache   Abdominal Pain   HPI Kimberly Poole Kimberly Poole is a 36 y.o. G2P1001 with pregnancy of unknown location. She presents to MAU with chief complaint of abdominal pain "like contractions". Pain is unchanged from her previous MAU encounter 07/31/21. Pain score is 7/10. Pain does not radiate.  She denies vaginal bleeding, dysuria, fever or recent illness.  Patient also complains of headache, new onset last night. Pain is at her occput and wraps around her head to behind her eyes. She took Tylenol around 0300 or 0400 this morning and experienced a small degree of relief.  Patient also complains of vaginal itching. She denies abnormal discharge, new sexual partner, new soaps or detergents.  Patient states she was given a paper for her repeat ultrasound. It listed an appointment for an ultrasound in MAU today.   OB History     Gravida  2   Para  1   Term  1   Preterm      AB      Living  1      SAB      IAB      Ectopic      Multiple      Live Births  1           Past Medical History:  Diagnosis Date   Anxiety    Depression    Gallstones    Hypotension     Past Surgical History:  Procedure Laterality Date   CESAREAN SECTION      No family history on file.  Social History   Tobacco Use   Smoking status: Never   Smokeless tobacco: Never  Substance Use Topics   Alcohol use: Yes    Comment: celebrations   Drug use: Not Currently    Allergies:  Allergies  Allergen Reactions   Diclofenac Itching    Medications Prior to Admission  Medication Sig Dispense Refill Last Dose   acetaminophen (TYLENOL) 500 MG tablet Take 500 mg by mouth every 6 (six) hours as needed for moderate pain, fever or headache.      B Complex Vitamins (VITAMIN B  COMPLEX) TABS Take 1 tablet by mouth daily.      citalopram (CELEXA) 20 MG tablet Take 20 mg by mouth at bedtime.      famotidine (PEPCID) 40 MG tablet Take 1 tablet (40 mg total) by mouth daily. 30 tablet 0    hydrOXYzine (ATARAX) 50 MG tablet Take 50 mg by mouth at bedtime.       Review of Systems  Gastrointestinal:  Positive for abdominal pain.  Genitourinary:        Vaginal itching  Neurological:  Positive for headaches.  All other systems reviewed and are negative. Physical Exam   Blood pressure 106/69, pulse 75, temperature 98.3 F (36.8 C), temperature source Oral, resp. rate 16, height 5\' 1"  (1.549 m), weight 74.2 kg, last menstrual period 06/18/2021, SpO2 99 %.  Physical Exam Vitals and nursing note reviewed.  Constitutional:      Appearance: She is well-developed. She is obese.  Cardiovascular:     Rate and Rhythm: Normal rate.  Pulmonary:     Effort: Pulmonary effort is normal.  Abdominal:     Palpations: Abdomen is soft.  Tenderness: There is no abdominal tenderness. There is no guarding.  Skin:    General: Skin is warm.     Capillary Refill: Capillary refill takes less than 2 seconds.  Neurological:     Mental Status: She is alert and oriented to person, place, and time.  Psychiatric:        Speech: Speech normal.        Behavior: Behavior normal.    MAU Course  Procedures  MDM Orders Placed This Encounter  Procedures   Wet prep, genital   US OB Transvaginal   Urinalysis, Routine w reflex microscopic Urine, Clean Catch   Nursing communication   Patient Vitals for the past 24 hrs:  BP Temp Temp src Pulse Resp SpO2 Height Weight  08/14/21 1035 106/69 98.3 F (36.8 C) Oral 75 16 99 % -- --  08/14/21 1034 -- -- -- -- -- -- 5\' 1"  (1.549 m) 74.2 kg   Results for orders placed or performed during the hospital encounter of 08/14/21 (from the past 24 hour(s))  Urinalysis, Routine w reflex microscopic Urine, Clean Catch     Status: Abnormal   Collection  Time: 08/14/21 10:41 AM  Result Value Ref Range   Color, Urine YELLOW YELLOW   APPearance HAZY (A) CLEAR   Specific Gravity, Urine 1.017 1.005 - 1.030   pH 7.0 5.0 - 8.0   Glucose, UA NEGATIVE NEGATIVE mg/dL   Hgb urine dipstick NEGATIVE NEGATIVE   Bilirubin Urine NEGATIVE NEGATIVE   Ketones, ur NEGATIVE NEGATIVE mg/dL   Protein, ur NEGATIVE NEGATIVE mg/dL   Nitrite NEGATIVE NEGATIVE   Leukocytes,Ua NEGATIVE NEGATIVE  Wet prep, genital     Status: Abnormal   Collection Time: 08/14/21 10:47 AM   Specimen: Vaginal  Result Value Ref Range   Yeast Wet Prep HPF POC NONE SEEN NONE SEEN   Trich, Wet Prep NONE SEEN NONE SEEN   Clue Cells Wet Prep HPF POC NONE SEEN NONE SEEN   WBC, Wet Prep HPF POC >=10 (A) <10   Sperm NONE SEEN    US OB Transvaginal  Result Date: 08/14/2021 CLINICAL DATA:  Lower abdominal pain.  Positive pregnancy test. EXAM: TRANSVAGINAL OB ULTRASOUND TECHNIQUE: Transvaginal ultrasound was performed for complete evaluation of the gestation as well as the maternal uterus, adnexal regions, and pelvic cul-de-sac. COMPARISON:  None Available. FINDINGS: Intrauterine gestational sac: Single Yolk sac:  Visualized Embryo:  Visualize Cardiac Activity: Visualize Heart Rate: 104 bpm CRL:   3.3 mm   5 w 6 d                  [place] EDC: 04/10/2022 Subchorionic hemorrhage:  None visualized. Maternal uterus/adnexae: Right ovary measures 1.3 x 2.4 x 1.5 cm, unremarkable. Left ovary measures 2.7 x 3.0 x 2.2 cm, unremarkable. No free fluid in the cul-de-sac. IMPRESSION: Single living intrauterine gestation at estimated 5 week 6 day gestational age by crown-rump length. Electronically Signed   By: Misty Stanley M.D.   On: 08/14/2021 12:09    Meds ordered this encounter  Medications   acetaminophen-caffeine (EXCEDRIN TENSION HEADACHE) 500-65 MG per tablet 2 tablet   Prenatal Vit-Fe Fumarate-FA (PREPLUS) 27-1 MG TABS    Sig: Take 1 tablet by mouth daily.    Dispense:  30 tablet    Refill:  13     Order Specific Question:   Supervising Provider    Answer:   Donnamae Jude [id number]   Assessment and Plan  --36 y.o. G2P1001 confirmed live  IUP measuring [redacted]w[redacted]d  --Headache resolved with Excedrin Tension --Language barrier: Spanish language interpreter utilized for all patient interaction --Discharge home in stable condition with first trimester precautions  F/U: Patient to select [facility] Provider and schedule care  Darlina Rumpf, [place], MSN, CNM 08/14/2021, 12:50 PM

## 2021-08-16 LAB — GC/CHLAMYDIA PROBE AMP (~~LOC~~) NOT AT ARMC
Chlamydia: NEGATIVE
Comment: NEGATIVE
Comment: NORMAL
Neisseria Gonorrhea: NEGATIVE

## 2021-09-15 ENCOUNTER — Other Ambulatory Visit: Payer: Self-pay

## 2021-09-15 ENCOUNTER — Inpatient Hospital Stay (HOSPITAL_COMMUNITY)
Admission: AD | Admit: 2021-09-15 | Discharge: 2021-09-16 | Disposition: A | Discharge status: Home / Self Care | Payer: Self-pay | Attending: Obstetrics and Gynecology | Admitting: Obstetrics and Gynecology

## 2021-09-15 DIAGNOSIS — G43809 Other migraine, not intractable, without status migrainosus: Secondary | ICD-10-CM

## 2021-09-15 DIAGNOSIS — O09521 Supervision of elderly multigravida, first trimester: Secondary | ICD-10-CM | POA: Insufficient documentation

## 2021-09-15 DIAGNOSIS — G43909 Migraine, unspecified, not intractable, without status migrainosus: Secondary | ICD-10-CM | POA: Insufficient documentation

## 2021-09-15 DIAGNOSIS — Z3A10 10 weeks gestation of pregnancy: Secondary | ICD-10-CM | POA: Insufficient documentation

## 2021-09-15 DIAGNOSIS — O99351 Diseases of the nervous system complicating pregnancy, first trimester: Secondary | ICD-10-CM | POA: Insufficient documentation

## 2021-09-15 DIAGNOSIS — Z603 Acculturation difficulty: Secondary | ICD-10-CM

## 2021-09-15 MED ORDER — DIPHENHYDRAMINE HCL 50 MG/ML IJ SOLN
25.0000 mg | Freq: Once | INTRAMUSCULAR | Status: AC
  Administered 2021-09-16: 25 mg via INTRAVENOUS
  Filled 2021-09-15: qty 1

## 2021-09-15 MED ORDER — METOCLOPRAMIDE HCL 5 MG/ML IJ SOLN
10.0000 mg | Freq: Once | INTRAMUSCULAR | Status: AC
  Administered 2021-09-16: 10 mg via INTRAVENOUS
  Filled 2021-09-15: qty 2

## 2021-09-15 MED ORDER — LACTATED RINGERS IV SOLN: Freq: Once | INTRAVENOUS | Status: AC

## 2021-09-15 NOTE — MAU Note (Addendum)
.  Kimberly Poole [name] is a 36 y.o. at [redacted]w[redacted]d here in MAU reporting severe headache this afternoon/evening. H/A started yesterday but not as bad as now. Pt is holding her head. Husband states pt says feels like her head has a lot of pressure in it and feels better for her to hold it. Some pain and pressure in back of head and neck. N/V today from a lot of stomach acid. Denies vag bleeding. Pt crying in Triage from pain. Pt tookTylenol Excedrin at 1700 which did not help. Has had headaches in the past but not to this extent  Onset of complaint: yesterday Pain score: 10 Vitals:   09/15/21 2332 09/15/21 2334  BP:  115/68  Pulse: 74   Resp: (!) 22 20  Temp: 98.5 F (36.9 C)   SpO2: 100%      FHT:did not attempt to listen in Triage. Lab orders placed from triage:

## 2021-09-16 ENCOUNTER — Inpatient Hospital Stay (HOSPITAL_COMMUNITY): Payer: Self-pay

## 2021-09-16 ENCOUNTER — Encounter (HOSPITAL_COMMUNITY): Payer: Self-pay | Admitting: Obstetrics and Gynecology

## 2021-09-16 ENCOUNTER — Other Ambulatory Visit (HOSPITAL_COMMUNITY): Payer: Self-pay

## 2021-09-16 DIAGNOSIS — Z3A10 10 weeks gestation of pregnancy: Secondary | ICD-10-CM

## 2021-09-16 DIAGNOSIS — G43809 Other migraine, not intractable, without status migrainosus: Secondary | ICD-10-CM

## 2021-09-16 DIAGNOSIS — Z603 Acculturation difficulty: Secondary | ICD-10-CM

## 2021-09-16 MED ORDER — CAFFEINE 200 MG PO TABS
200.0000 mg | ORAL_TABLET | Freq: Once | ORAL | Status: AC
  Administered 2021-09-16: 200 mg via ORAL
  Filled 2021-09-16: qty 1

## 2021-09-16 MED ORDER — MAGNESIUM SULFATE 2 GM/50ML IV SOLN
2.0000 g | Freq: Once | INTRAVENOUS | Status: AC
  Administered 2021-09-16: 2 g via INTRAVENOUS
  Filled 2021-09-16: qty 50

## 2021-09-16 MED ORDER — EXCEDRIN TENSION HEADACHE 500-65 MG PO TABS
2.0000 | ORAL_TABLET | Freq: Four times a day (QID) | ORAL | 0 refills | Status: DC | PRN
Start: 2021-09-16 — End: 2022-01-25
  Filled 2021-09-16: qty 60, 8d supply, fill #0

## 2021-09-16 MED ORDER — OXYCODONE HCL 5 MG PO TABS
5.0000 mg | ORAL_TABLET | Freq: Once | ORAL | Status: DC
  Filled 2021-09-16: qty 1

## 2021-09-16 MED ORDER — LACTATED RINGERS IV SOLN: Freq: Once | INTRAVENOUS | Status: AC

## 2021-09-16 MED ORDER — ACETAMINOPHEN 500 MG PO TABS
1000.0000 mg | ORAL_TABLET | Freq: Four times a day (QID) | ORAL | Status: DC | PRN
  Administered 2021-09-16: 1000 mg via ORAL
  Filled 2021-09-16: qty 2

## 2021-09-16 MED ORDER — DEXAMETHASONE SODIUM PHOSPHATE 10 MG/ML IJ SOLN
10.0000 mg | Freq: Once | INTRAMUSCULAR | Status: AC
  Administered 2021-09-16: 10 mg via INTRAVENOUS
  Filled 2021-09-16: qty 1

## 2021-09-16 MED ORDER — OXYCODONE HCL 5 MG PO TABS
5.0000 mg | ORAL_TABLET | Freq: Once | ORAL | Status: AC
  Administered 2021-09-16: 5 mg via ORAL

## 2021-09-16 NOTE — MAU Provider Note (Signed)
Chief Complaint: Headache   Event Date/Time   First Provider Initiated Contact with Patient 09/16/21 0025        SUBJECTIVE HPI: Kimberly Poole is a 36 y.o. G2P1001 at [redacted]w[redacted]d by LMP who presents to maternity admissions reporting severe migraine headache.  States it is frontal and occipital and pounding.   Has had migraines since early teens, has had a few in past that were this bad, but this is the worst one recently .  Took Tylenol which did not help. .+ photophobia   No weakness or numbness.  No problems with speech She denies vaginal bleeding, vaginal itching/burning, urinary symptoms,  dizziness, n/v, or fever/chills.    Headache  This is a new problem. The current episode started yesterday. The problem occurs constantly. The problem has been gradually worsening. The pain is located in the Frontal and occipital region. The pain quality is similar to prior headaches. The quality of the pain is described as aching, throbbing and pulsating. Associated symptoms include photophobia. Pertinent negatives include no abdominal pain, back pain, dizziness, fever, muscle aches, nausea, sinus pressure, vomiting or weakness. Nothing aggravates the symptoms. She has tried nothing for the symptoms. Her past medical history is significant for migraine headaches.   RN Note: [name] is a 36 y.o. at [redacted]w[redacted]d here in MAU reporting severe headache this afternoon/evening. H/A started yesterday but not as bad as now. Pt is holding her head. Husband states pt says feels like her head has a lot of pressure in it and feels better for her to hold it. Some pain and pressure in back of head and neck. N/V today from a lot of stomach acid. Denies vag bleeding. Pt crying in Triage from pain. Pt tookTylenol Excedrin at 1700 which did not help. Has had headaches in the past but not to this extent  Onset of complaint: yesterday Pain score: 10  Past Medical History:  Diagnosis Date    Anxiety    Depression    Gallstones    Hypotension    Past Surgical History:  Procedure Laterality Date   CESAREAN SECTION     Social History   Socioeconomic History   Marital status: Married    Spouse name: Not on file   Number of children: Not on file   Years of education: Not on file   Highest education level: Not on file  Occupational History   Not on file  Tobacco Use   Smoking status: Never   Smokeless tobacco: Never  Substance and Sexual Activity   Alcohol use: Yes    Comment: celebrations   Drug use: Not Currently   Sexual activity: Yes  Other Topics Concern   Not on file  Social History Narrative   ** Merged History Encounter **       Social Determinants of Health   Financial Resource Strain: Not on file  Food Insecurity: Not on file  Transportation Needs: Not on file  Physical Activity: Not on file  Stress: Not on file  Social Connections: Not on file  Intimate Partner Violence: Not on file   No current facility-administered medications on file prior to encounter.   Current Outpatient Medications on File Prior to Encounter  Medication Sig Dispense Refill   acetaminophen (TYLENOL) 500 MG tablet Take 500 mg by mouth every 6 (six) hours as needed for moderate pain, fever or headache.     citalopram (CELEXA) 20 MG tablet Take 20 mg by mouth at bedtime.  hydrOXYzine (ATARAX) 50 MG tablet Take 50 mg by mouth at bedtime.     Prenatal Vit-Fe Fumarate-FA (PREPLUS) 27-1 MG TABS Take 1 tablet by mouth daily. 30 tablet 13   B Complex Vitamins (VITAMIN B COMPLEX) TABS Take 1 tablet by mouth daily.     famotidine (PEPCID) 40 MG tablet Take 1 tablet (40 mg total) by mouth daily. 30 tablet 0   Allergies  Allergen Reactions   Diclofenac Itching    I have reviewed patient's Past Medical Hx, Surgical Hx, Family Hx, Social Hx, medications and allergies.   ROS:  Review of Systems  Constitutional:  Negative for fever.  HENT:  Negative for sinus pressure.    Eyes:  Positive for photophobia.  Gastrointestinal:  Negative for abdominal pain, nausea and vomiting.  Musculoskeletal:  Negative for back pain.  Neurological:  Positive for headaches. Negative for dizziness and weakness.   Review of Systems  Other systems negative   Physical Exam  Physical Exam Patient Vitals for the past 24 hrs:  BP Temp Pulse Resp SpO2 Height Weight  09/15/21 2334 115/68 -- -- 20 -- -- --  09/15/21 2332 -- 98.5 F (36.9 C) 74 (!) 22 100 % 5\' 1"  (1.549 m) 73.9 kg   Constitutional: Well-developed, well-nourished female in no acute distress.  Cardiovascular: normal rate Respiratory: normal effort GI: Abd soft, non-tender. Pos BS x 4 MS: Extremities nontender, no edema, normal ROM Neurologic: Alert and oriented x 4.  GU: Neg CVAT.  LAB RESULTS No results found for this or any previous visit (from the past 24 hour(s)).     IMAGING Informal bedside done to ascertain FHR.  FHR 150s  Single live fetus, active, appearance consistent with dates.   Brain MRI without contrast is within normal limits   MAU Management/MDM: I have reviewed the triage vital signs and the nursing notes.   Pertinent labs & imaging results that were available during my care of the patient were reviewed by me and considered in my medical decision making (see chart for details).      I have reviewed her medical records including past results, notes and treatments.   Given IV fluids, Reglan and Benadryl   Magnesium sulfate 2g given. Caffeine given  These did not produce relief  Consult Neurology Hospitalist with presentation, exam findings, and results.    He recommended adding Dexamethasone and more IV fluids.  When they did not produce relief, he recommended MRI of brain without contrast.   If normal will send home with outpatient neurology followup   Returned from MRI and still had some pain   Tylenol 1000mg  and Oxy 5mg  given She got good relief from this   She requested "new  Medications" for anxiety and sleep. States some doctor gave her pill s for these but "they don't work" and she wants different ones. Discussed this is something that needs to be dealt  with during prenatal care.  ASSESSMENT Single IUP at [redacted]w[redacted]d Migraine headache  PLAN Discharge home Has list of [facility] providers Will need to be referred to Neurologist for ongoing migraine care  Pt stable at time of discharge. Encouraged to return here if she develops worsening of symptoms, increase in pain, fever, or other concerning symptoms.    CNM, MSN Certified Nurse-Midwife 09/16/2021  12:25 AM

## 2021-09-22 ENCOUNTER — Encounter (HOSPITAL_COMMUNITY): Payer: Self-pay | Admitting: Obstetrics & Gynecology

## 2021-09-22 ENCOUNTER — Inpatient Hospital Stay (HOSPITAL_COMMUNITY)
Admission: AD | Admit: 2021-09-22 | Discharge: 2021-09-23 | Disposition: A | Discharge status: Home / Self Care | Payer: Self-pay | Attending: Obstetrics & Gynecology | Admitting: Obstetrics & Gynecology

## 2021-09-22 ENCOUNTER — Other Ambulatory Visit: Payer: Self-pay

## 2021-09-22 DIAGNOSIS — K2950 Unspecified chronic gastritis without bleeding: Secondary | ICD-10-CM

## 2021-09-22 DIAGNOSIS — O99351 Diseases of the nervous system complicating pregnancy, first trimester: Secondary | ICD-10-CM | POA: Insufficient documentation

## 2021-09-22 DIAGNOSIS — K219 Gastro-esophageal reflux disease without esophagitis: Secondary | ICD-10-CM | POA: Insufficient documentation

## 2021-09-22 DIAGNOSIS — Z603 Acculturation difficulty: Secondary | ICD-10-CM

## 2021-09-22 DIAGNOSIS — O0931 Supervision of pregnancy with insufficient antenatal care, first trimester: Secondary | ICD-10-CM | POA: Insufficient documentation

## 2021-09-22 DIAGNOSIS — G43909 Migraine, unspecified, not intractable, without status migrainosus: Secondary | ICD-10-CM | POA: Insufficient documentation

## 2021-09-22 DIAGNOSIS — G43809 Other migraine, not intractable, without status migrainosus: Secondary | ICD-10-CM

## 2021-09-22 DIAGNOSIS — Z3A11 11 weeks gestation of pregnancy: Secondary | ICD-10-CM | POA: Insufficient documentation

## 2021-09-22 DIAGNOSIS — O99611 Diseases of the digestive system complicating pregnancy, first trimester: Secondary | ICD-10-CM | POA: Insufficient documentation

## 2021-09-22 LAB — COMPREHENSIVE METABOLIC PANEL
ALT: 17 U/L (ref 0–44)
AST: 17 U/L (ref 15–41)
Albumin: 3.4 g/dL — ABNORMAL LOW (ref 3.5–5.0)
Alkaline Phosphatase: 56 U/L (ref 38–126)
Anion gap: 8 (ref 5–15)
BUN: 8 mg/dL (ref 6–20)
CO2: 19 mmol/L — ABNORMAL LOW (ref 22–32)
Calcium: 8.6 mg/dL — ABNORMAL LOW (ref 8.9–10.3)
Chloride: 109 mmol/L (ref 98–111)
Creatinine, Ser: 0.65 mg/dL (ref 0.44–1.00)
GFR, Estimated: >60 mL/min (ref >60)
Glucose, Bld: 90 mg/dL (ref 70–99)
Potassium: 3.8 mmol/L (ref 3.5–5.1)
Sodium: 136 mmol/L (ref 135–145)
Total Bilirubin: 0.2 mg/dL — ABNORMAL LOW (ref 0.3–1.2)
Total Protein: 6.5 g/dL (ref 6.5–8.1)

## 2021-09-22 MED ORDER — OXYCODONE HCL 5 MG PO TABS
5.0000 mg | ORAL_TABLET | Freq: Once | ORAL | Status: AC
  Administered 2021-09-22: 5 mg via ORAL
  Filled 2021-09-22: qty 1

## 2021-09-22 MED ORDER — ALUM & MAG HYDROXIDE-SIMETH 200-200-20 MG/5ML PO SUSP
30.0000 mL | Freq: Once | ORAL | Status: AC
  Administered 2021-09-22: 30 mL via ORAL
  Filled 2021-09-22: qty 30

## 2021-09-22 MED ORDER — METOCLOPRAMIDE HCL 5 MG/ML IJ SOLN
10.0000 mg | Freq: Once | INTRAMUSCULAR | Status: AC
  Administered 2021-09-22: 10 mg via INTRAVENOUS
  Filled 2021-09-22: qty 2

## 2021-09-22 MED ORDER — CAFFEINE 200 MG PO TABS
200.0000 mg | ORAL_TABLET | Freq: Once | ORAL | Status: AC
  Administered 2021-09-22: 200 mg via ORAL
  Filled 2021-09-22: qty 1

## 2021-09-22 MED ORDER — DIPHENHYDRAMINE HCL 50 MG/ML IJ SOLN
25.0000 mg | Freq: Once | INTRAMUSCULAR | Status: AC
  Administered 2021-09-22: 25 mg via INTRAVENOUS
  Filled 2021-09-22: qty 1

## 2021-09-22 MED ORDER — LACTATED RINGERS IV BOLUS
1000.0000 mL | Freq: Once | INTRAVENOUS | Status: AC
  Administered 2021-09-22: 1000 mL via INTRAVENOUS

## 2021-09-22 MED ORDER — HYOSCYAMINE SULFATE 0.125 MG SL SUBL
0.2500 mg | SUBLINGUAL_TABLET | Freq: Once | SUBLINGUAL | Status: AC
  Administered 2021-09-22: 0.25 mg via SUBLINGUAL
  Filled 2021-09-22: qty 2

## 2021-09-22 MED ORDER — MAGNESIUM SULFATE 2 GM/50ML IV SOLN
2.0000 g | Freq: Once | INTRAVENOUS | Status: AC
  Administered 2021-09-22: 2 g via INTRAVENOUS
  Filled 2021-09-22: qty 50

## 2021-09-22 MED ORDER — CYCLOBENZAPRINE HCL 5 MG PO TABS
10.0000 mg | ORAL_TABLET | Freq: Once | ORAL | Status: AC
Start: 2021-09-22 — End: 2021-09-22
  Administered 2021-09-22: 10 mg via ORAL
  Filled 2021-09-22: qty 2

## 2021-09-22 MED ORDER — ONDANSETRON HCL 4 MG/2ML IJ SOLN
4.0000 mg | Freq: Once | INTRAMUSCULAR | Status: AC
  Administered 2021-09-22: 4 mg via INTRAVENOUS
  Filled 2021-09-22: qty 2

## 2021-09-22 NOTE — MAU Provider Note (Addendum)
History     [id number]  Arrival date and time: 09/22/21 1759    Chief Complaint  Patient presents with   Headache     HPI Kimberly Poole is a 36 y.o. at [redacted]w[redacted]d by early [place] with PMHx notable for migraines, who presents for headache.   Patient has been seen in the MAU several times for headaches Reports she has had a bad headache since early this morning She has been taking tylenol and excedrin tension fairly often Headache is in the left side of her head in her temple and behind her eye No tearing or excessive lacrimation from that eye Medicines earlier today did not help No vaginal bleeding or leaking fluid     OB History     Gravida  2   Para  1   Term  1   Preterm      AB      Living  1      SAB      IAB      Ectopic      Multiple      Live Births  1           Past Medical History:  Diagnosis Date   Anxiety    Depression    Gallstones    Hypotension     Past Surgical History:  Procedure Laterality Date   CESAREAN SECTION      Family History  Problem Relation Age of Onset   Stroke Father     Social History   Socioeconomic History   Marital status: Married    Spouse name: Not on file   Number of children: Not on file   Years of education: Not on file   Highest education level: Not on file  Occupational History   Not on file  Tobacco Use   Smoking status: Never   Smokeless tobacco: Never  Substance and Sexual Activity   Alcohol use: Not Currently    Comment: celebrations   Drug use: Not Currently   Sexual activity: Yes  Other Topics Concern   Not on file  Social History Narrative   ** Merged History Encounter **       Social Determinants of Health   Financial Resource Strain: Not on file  Food Insecurity: Not on file  Transportation Needs: Not on file  Physical Activity: Not on file  Stress: Not on file  Social Connections: Not on file  Intimate Partner Violence: Not on file    Allergies   Allergen Reactions   Diclofenac Itching    No current facility-administered medications on file prior to encounter.   Current Outpatient Medications on File Prior to Encounter  Medication Sig Dispense Refill   acetaminophen-caffeine (EXCEDRIN TENSION HEADACHE) 500-65 MG TABS per tablet Take 2 tablets by mouth every 6 (six) hours as needed (headache). 60 tablet 0   citalopram (CELEXA) 20 MG tablet Take 20 mg by mouth at bedtime.     hydrOXYzine (ATARAX) 50 MG tablet Take 50 mg by mouth at bedtime.     Prenatal Vit-Fe Fumarate-FA (PREPLUS) 27-1 MG TABS Take 1 tablet by mouth daily. 30 tablet 13   B Complex Vitamins (VITAMIN B COMPLEX) TABS Take 1 tablet by mouth daily.     famotidine (PEPCID) 40 MG tablet Take 1 tablet (40 mg total) by mouth daily. 30 tablet 0     ROS Pertinent positives and negative per HPI, all others reviewed and negative  Physical Exam   BP  122/79 (BP Location: Right Arm)   Pulse 85   Temp 98.6 F (37 C) (Oral)   Resp 20   Wt 74.3 kg   LMP 06/18/2021   SpO2 99%   BMI 30.95 kg/m   Patient Vitals for the past 24 hrs:  BP Temp Temp src Pulse Resp SpO2 Weight  09/22/21 1833 122/79 98.6 F (37 C) Oral 85 20 99 % --  09/22/21 1832 -- -- -- -- -- -- 74.3 kg    Physical Exam Vitals reviewed.  Constitutional:      General: She is not in acute distress.    Appearance: She is well-developed. She is not diaphoretic.  Eyes:     General: No scleral icterus. Pulmonary:     Effort: Pulmonary effort is normal. No respiratory distress.  Skin:    General: Skin is warm and dry.  Neurological:     Mental Status: She is alert.     Coordination: Coordination normal.      Cervical Exam    Bedside Ultrasound Pt informed that the ultrasound is considered a limited OB ultrasound and is not intended to be a complete ultrasound exam.  Patient also informed that the ultrasound is not being completed with the intent of assessing for fetal or placental anomalies or  any pelvic abnormalities.  Explained that the purpose of today's ultrasound is to assess for  viability.  Patient acknowledges the purpose of the exam and the limitations of the study.    My interpretation: viable IUP with FHR 156 bpm by M-mode, normal somatic movements as well   Labs No results found for this or any previous visit (from the past 24 hour(s)).  Imaging No results found.  MAU Course  Procedures  Lab Orders         Comprehensive metabolic panel    Meds ordered this encounter  Medications   lactated ringers bolus 1,000 mL   cyclobenzaprine (FLEXERIL) tablet 10 mg   metoCLOPramide (REGLAN) injection 10 mg   caffeine tablet 200 mg   Imaging Orders  No imaging studies ordered today    MDM moderate  Assessment and Plan  #Migraine headache #[redacted] weeks gestation of pregnancy Similar symptoms to prior. Will trial meds, give fluids. Will also check CMP, she has been taking a significant amount of tylenol.   #FWB FHR 156 bpm by [place]   Dispo: TBD, signed out to oncoming provider Wynelle Bourgeois CNM.   Results for orders placed or performed during the hospital encounter of 09/22/21 (from the past 24 hour(s))  Comprehensive metabolic panel     Status: Abnormal   Collection Time: 09/22/21  7:29 PM  Result Value Ref Range   Sodium 136 135 - 145 mmol/L   Potassium 3.8 3.5 - 5.1 mmol/L   Chloride 109 98 - 111 mmol/L   CO2 19 (L) 22 - 32 mmol/L   Glucose, Bld 90 70 - 99 mg/dL   BUN 8 6 - 20 mg/dL   Creatinine, Ser 8.29 0.44 - 1.00 mg/dL   Calcium 8.6 (L) 8.9 - 10.3 mg/dL   Total Protein 6.5 6.5 - 8.1 g/dL   Albumin 3.4 (L) 3.5 - 5.0 g/dL   AST 17 15 - 41 U/L   ALT 17 0 - 44 U/L   Alkaline Phosphatase 56 38 - 126 U/L   Total Bilirubin 0.2 (L) 0.3 - 1.2 mg/dL   GFR, Estimated >93 >71 mL/min   Anion gap 8 5 - 15     Molli Hazard  Birder Robson, MD/MPH 09/22/21 7:22 PM  2045 hrs Patient states her headache has gone from a "10" to a "7" Will add Magnesium Sulfate 2gm and  Benadryl 25mg  to see if we get improvement  2200 hrs: Pain is down to a "5"   But c/o nausea and epigastric pain "gastritis"  Will given Zofran and Maalox plus Levsin  Got some relief from above meds.  Headache is mostly gone but still has pain around left eye with light sensitivity.  Oxy IR 5mg  given with good relief  Discussed needs good long term Neurology followup   MEssage sent to [facility] office to arrange [facility] so that they can refer to neurology  A:  Single IUP at [redacted]w[redacted]d       Migraine headache, NEGATIVE MRI done 09/16/21       Gastritis/reflux       No prenatal care  P:     Discharge home        Will change Pepcid to Protonix        Discussed appropriate dosing for Tylenol         Message sent to office to start [facility] and arrange referral to Neurology        Encouraged to return if she develops worsening of symptoms, increase in pain, fever, or other concerning symptoms.   09/18/21, CNM

## 2021-09-22 NOTE — MAU Note (Signed)
Kimberly Poole is a 36 y.o. at [redacted]w[redacted]d here in MAU reporting: having she has a Hx of migraines and has had a severe H/A since 0400.  Also reports pain in both ears and eyes, but more on the left side. Reports has taken 2 doses of Excedrin and one dose of Tylenol and has had no relief.  Last dose of Excedrin was @ 1500. LMP: N/A Onset of complaint: today Pain score: 10/10 Vitals:   09/22/21 1833  BP: 122/79  Pulse: 85  Resp: 20  Temp: 98.6 F (37 C)  SpO2: 99%     FHT: deferred Lab orders placed from triage:   None

## 2021-09-22 NOTE — MAU Provider Note (Incomplete Revision)
History     [id number]  Arrival date and time: 09/22/21 1759    Chief Complaint  Patient presents with   Headache     HPI Kimberly Poole is a 36 y.o. at [redacted]w[redacted]d by early [place] with PMHx notable for migraines, who presents for headache.   Patient has been seen in the MAU several times for headaches Reports she has had a bad headache since early this morning She has been taking tylenol and excedrin tension fairly often Headache is in the left side of her head in her temple and behind her eye No tearing or excessive lacrimation from that eye Medicines earlier today did not help No vaginal bleeding or leaking fluid     OB History     Gravida  2   Para  1   Term  1   Preterm      AB      Living  1      SAB      IAB      Ectopic      Multiple      Live Births  1           Past Medical History:  Diagnosis Date   Anxiety    Depression    Gallstones    Hypotension     Past Surgical History:  Procedure Laterality Date   CESAREAN SECTION      Family History  Problem Relation Age of Onset   Stroke Father     Social History   Socioeconomic History   Marital status: Married    Spouse name: Not on file   Number of children: Not on file   Years of education: Not on file   Highest education level: Not on file  Occupational History   Not on file  Tobacco Use   Smoking status: Never   Smokeless tobacco: Never  Substance and Sexual Activity   Alcohol use: Not Currently    Comment: celebrations   Drug use: Not Currently   Sexual activity: Yes  Other Topics Concern   Not on file  Social History Narrative   ** Merged History Encounter **       Social Determinants of Health   Financial Resource Strain: Not on file  Food Insecurity: Not on file  Transportation Needs: Not on file  Physical Activity: Not on file  Stress: Not on file  Social Connections: Not on file  Intimate Partner Violence: Not on file    Allergies   Allergen Reactions   Diclofenac Itching    No current facility-administered medications on file prior to encounter.   Current Outpatient Medications on File Prior to Encounter  Medication Sig Dispense Refill   acetaminophen-caffeine (EXCEDRIN TENSION HEADACHE) 500-65 MG TABS per tablet Take 2 tablets by mouth every 6 (six) hours as needed (headache). 60 tablet 0   citalopram (CELEXA) 20 MG tablet Take 20 mg by mouth at bedtime.     hydrOXYzine (ATARAX) 50 MG tablet Take 50 mg by mouth at bedtime.     Prenatal Vit-Fe Fumarate-FA (PREPLUS) 27-1 MG TABS Take 1 tablet by mouth daily. 30 tablet 13   B Complex Vitamins (VITAMIN B COMPLEX) TABS Take 1 tablet by mouth daily.     famotidine (PEPCID) 40 MG tablet Take 1 tablet (40 mg total) by mouth daily. 30 tablet 0     ROS Pertinent positives and negative per HPI, all others reviewed and negative  Physical Exam   BP  122/79 (BP Location: Right Arm)   Pulse 85   Temp 98.6 F (37 C) (Oral)   Resp 20   Wt 74.3 kg   LMP 06/18/2021   SpO2 99%   BMI 30.95 kg/m   Patient Vitals for the past 24 hrs:  BP Temp Temp src Pulse Resp SpO2 Weight  09/22/21 1833 122/79 98.6 F (37 C) Oral 85 20 99 % --  09/22/21 1832 -- -- -- -- -- -- 74.3 kg    Physical Exam Vitals reviewed.  Constitutional:      General: She is not in acute distress.    Appearance: She is well-developed. She is not diaphoretic.  Eyes:     General: No scleral icterus. Pulmonary:     Effort: Pulmonary effort is normal. No respiratory distress.  Skin:    General: Skin is warm and dry.  Neurological:     Mental Status: She is alert.     Coordination: Coordination normal.      Cervical Exam    Bedside Ultrasound Pt informed that the ultrasound is considered a limited OB ultrasound and is not intended to be a complete ultrasound exam.  Patient also informed that the ultrasound is not being completed with the intent of assessing for fetal or placental anomalies or  any pelvic abnormalities.  Explained that the purpose of today's ultrasound is to assess for  viability.  Patient acknowledges the purpose of the exam and the limitations of the study.    My interpretation: viable IUP with FHR 156 bpm by M-mode, normal somatic movements as well   Labs No results found for this or any previous visit (from the past 24 hour(s)).  Imaging No results found.  MAU Course  Procedures  Lab Orders         Comprehensive metabolic panel    Meds ordered this encounter  Medications   lactated ringers bolus 1,000 mL   cyclobenzaprine (FLEXERIL) tablet 10 mg   metoCLOPramide (REGLAN) injection 10 mg   caffeine tablet 200 mg   Imaging Orders  No imaging studies ordered today    MDM moderate  Assessment and Plan  #Migraine headache #[redacted] weeks gestation of pregnancy Similar symptoms to prior. Will trial meds, give fluids. Will also check CMP, she has been taking a significant amount of tylenol.   #FWB FHR 156 bpm by [place]   Dispo: TBD, signed out to oncoming provider Wynelle Bourgeois CNM.     Venora Maples, MD/MPH 09/22/21 7:22 PM  2045 hrs Patient states her headache has gone from a "10" to a "7" Will add Magnesium Sulfate 2gm and Benadryl 25mg  to see if we get improvement  2200 hrs: Pain is down to a "5"   But c/o nausea and epigastric pain "gastritis"  Will given Zofran and Maalox plus Levsin

## 2021-09-23 DIAGNOSIS — Z603 Acculturation difficulty: Secondary | ICD-10-CM

## 2021-09-23 DIAGNOSIS — K2950 Unspecified chronic gastritis without bleeding: Secondary | ICD-10-CM

## 2021-09-23 DIAGNOSIS — G43809 Other migraine, not intractable, without status migrainosus: Secondary | ICD-10-CM

## 2021-09-23 DIAGNOSIS — Z3A11 11 weeks gestation of pregnancy: Secondary | ICD-10-CM

## 2021-09-23 MED ORDER — PANTOPRAZOLE SODIUM 20 MG PO TBEC
20.0000 mg | DELAYED_RELEASE_TABLET | Freq: Every day | ORAL | 0 refills | Status: DC
Start: 2021-09-23 — End: 2021-09-29

## 2021-09-29 ENCOUNTER — Telehealth: Payer: Self-pay | Admitting: *Deleted

## 2021-09-29 ENCOUNTER — Telehealth (INDEPENDENT_AMBULATORY_CARE_PROVIDER_SITE_OTHER): Payer: Self-pay | Admitting: *Deleted

## 2021-09-29 VITALS — BP 114/79 | HR 70 | Wt 164.0 lb

## 2021-09-29 DIAGNOSIS — O09529 Supervision of elderly multigravida, unspecified trimester: Secondary | ICD-10-CM | POA: Insufficient documentation

## 2021-09-29 DIAGNOSIS — F1991 Other psychoactive substance use, unspecified, in remission: Secondary | ICD-10-CM

## 2021-09-29 DIAGNOSIS — G43809 Other migraine, not intractable, without status migrainosus: Secondary | ICD-10-CM

## 2021-09-29 DIAGNOSIS — O0990 Supervision of high risk pregnancy, unspecified, unspecified trimester: Secondary | ICD-10-CM | POA: Insufficient documentation

## 2021-09-29 DIAGNOSIS — G43909 Migraine, unspecified, not intractable, without status migrainosus: Secondary | ICD-10-CM | POA: Insufficient documentation

## 2021-09-29 DIAGNOSIS — O219 Vomiting of pregnancy, unspecified: Secondary | ICD-10-CM

## 2021-09-29 DIAGNOSIS — Z9189 Other specified personal risk factors, not elsewhere classified: Secondary | ICD-10-CM

## 2021-09-29 DIAGNOSIS — O99340 Other mental disorders complicating pregnancy, unspecified trimester: Secondary | ICD-10-CM

## 2021-09-29 DIAGNOSIS — Z603 Acculturation difficulty: Secondary | ICD-10-CM

## 2021-09-29 DIAGNOSIS — F32A Depression, unspecified: Secondary | ICD-10-CM

## 2021-09-29 MED ORDER — PROMETHAZINE HCL 25 MG PO TABS: 25.0000 mg | ORAL_TABLET | Freq: Four times a day (QID) | ORAL | 1 refills | Status: DC | PRN

## 2021-09-29 MED ORDER — PANTOPRAZOLE SODIUM 20 MG PO TBEC: 40.0000 mg | DELAYED_RELEASE_TABLET | Freq: Every day | ORAL | 0 refills | Status: DC

## 2021-09-29 MED ORDER — CITALOPRAM HYDROBROMIDE 20 MG PO TABS: 40.0000 mg | ORAL_TABLET | Freq: Every day | ORAL | 0 refills | Status: DC

## 2021-09-29 NOTE — Progress Notes (Addendum)
New OB Intake  Patient came to office for  intake today.  We discussed her EDD of 04/10/22 that is based on US2. Pt is G2/P1001. I reviewed her allergies, medications, Medical/Surgical/OB history, and appropriate screenings. I informed her of [facility] services. Referral made. Also informed patient about 24/7 [facility] care across from our office and may go anytime she feels she needs to be seen right away or thoughts of hurting self. See below for more information. [facility] information placed in AVS. Based on history, this is a/an  pregnancy complicated by AMA, Hx overdose, hx drug use, Depression, Migraines  .   Patient Active Problem List   Diagnosis Date Noted   Supervision of high risk pregnancy, antepartum 09/29/2021   AMA (advanced maternal age) multigravida 35+ 09/29/2021   History of drug overdose 09/29/2021   History of drug use 09/29/2021   Migraines    Depression    Language barrier, cultural differences 09/16/2021    Concerns addressed today Visit took 2  hours, not all issues addressed- very difficult to get answers from patient.  Patient with many issues. C/o nausea, not sleeping, depression, states taking meds and sometimes takes 2 Celexa instead of 1 ordered and takes 2 Hydroxine instead of 1 ordered. History depression, overdose. Today phq9 elevated. Patient with flat affect. [facility] referral ordered. Jamie,[facility] unable to speak with patient today. Dr. Debroah Loop in to talk with patient and assess patient and agrees with [facility] referral , new orders for Celexa and Hydroxine and Phenergan given. Patient states supposed to see Neurologist. Referral was ordered in ED but not scheduled. Explained I will schedule and notify her asap. Neuro exam by Dr. Debroah Loop wnl.  After Dr. In patient also c/o diarrhea . Explained may take Immodium AD or Kaopectate. She also c/o not feeling like a good Mother.  Delivery Plans Plans to deliver at [facility]. Patient is not interested in water birth.    MyChart/Babyscripts Not addressed due to length of visit, and multiple issues.   Blood Pressure Cuff/Weight Scale  Not addressed due to length of visit and issues.    Anatomy US Explained first scheduled [place] will be around 19 weeks. Anatomy US will be scheduled and patient notified.   Labs Not addressed or drawn as lab closed for lunch at end of visit and patient not able to wait.   Covid Vaccine Not addressed  Is patient a CenteringPregnancy candidate?              Not a candidate due to Uncontrolled BH Is patient a Mom+Baby Combined Care candidate?  Not a candidate     Social Determinants of Health Not addressed  First visit review I reviewed new OB appt with pt. I explained she will have a provider visit that includes pregnancy exam , labs. Explained pt will be seen by Florestine Avers at first visit; encounter routed to appropriate provider. Explained that patient will be seen by pregnancy navigator following visit with provider.   Nancy Fetter 09/29/2021  2:14 PM

## 2021-09-29 NOTE — Telephone Encounter (Signed)
I called Patient with Kimberly Poole , Interpreter and informed her of [place] appointment. I also informed her of referral to [facility] Neurology and that they will call her soon with appointment. I also reminded her if she feels more depressed or thoughts of hurting self or needs to talk to someone can go to [facility] Urgent care across from our office. She voices understanding.  Nancy Fetter

## 2021-09-30 ENCOUNTER — Other Ambulatory Visit (HOSPITAL_COMMUNITY): Payer: Self-pay

## 2021-10-10 ENCOUNTER — Institutional Professional Consult (permissible substitution): Payer: Self-pay

## 2021-10-11 NOTE — BH Specialist Note (Deleted)
Integrated Behavioral Health Initial In-Person Visit  MRN: [id number] Name: Maddy Graham  Number of Integrated Behavioral Health Clinician visits: No data recorded Session Start time: No data recorded   Session End time: No data recorded Total time in minutes: No data recorded  Types of Service: {CHL AMB TYPE OF SERVICE:[phone]}  Interpretor:{yes SF:681275} Interpretor Name and Language: ***   Warm Hand Off Completed.        Subjective: Adrianne Shackleton is a 36 y.o. female accompanied by {CHL AMB ACCOMPANIED TZ:[id number]} Patient was referred by Scheryl Darter, MD for positive depression screen. Patient reports the following symptoms/concerns: *** Duration of problem: ***; Severity of problem: {Mild/Moderate/Severe:20260}  Objective: Mood: {BHH MOOD:22306} and Affect: {BHH AFFECT:22307} Risk of harm to self or others: {CHL AMB BH Suicide Current Mental Status:21022748}  Life Context: Family and Social: *** School/Work: *** Self-Care: *** Life Changes: ***  Patient and/or Family's Strengths/Protective Factors: {CHL AMB BH PROTECTIVE FACTORS:[phone]}  Goals Addressed: Patient will: Reduce symptoms of: {IBH Symptoms:21014056} Increase knowledge and/or ability of: {IBH Patient Tools:21014057}  Demonstrate ability to: {IBH Goals:21014053}  Progress towards Goals: Ongoing  Interventions: Interventions utilized: {IBH Interventions:21014054}  Standardized Assessments completed: {IBH Screening Tools:21014051}  Patient and/or Family Response: Patient agrees with treatment plan.   Patient Centered Plan: Patient is on the following Treatment Plan(s):  IBH  Assessment: Patient currently experiencing ***.   Patient may benefit from psychoeducation and brief therapeutic interventions regarding coping with symptoms of *** .  Plan: Follow up with behavioral health clinician on : *** Behavioral recommendations:   -*** -*** Referral(s): {IBH Referrals:21014055}  Rae Lips, LCSW     09/29/2021   11:46 AM  Depression screen PHQ 2/9  Decreased Interest 3  Down, Depressed, Hopeless 3  PHQ - 2 Score 6  Altered sleeping 3  Tired, decreased energy 3  Change in appetite 3  Feeling bad or failure about yourself  3  Trouble concentrating 0  Moving slowly or fidgety/restless 0  Suicidal thoughts 0  PHQ-9 Score 18      09/29/2021   11:54 AM  GAD 7 : Generalized Anxiety Score  Nervous, Anxious, on Edge 3  Control/stop worrying 3  Worry too much - different things 3  Trouble relaxing 3  Restless 3  Easily annoyed or irritable 1  Afraid - awful might happen 1  Total GAD 7 Score 17

## 2021-10-16 ENCOUNTER — Inpatient Hospital Stay (HOSPITAL_COMMUNITY)
Admission: AD | Admit: 2021-10-16 | Discharge: 2021-10-17 | Disposition: A | Discharge status: Home / Self Care | Payer: Self-pay | Attending: Family Medicine | Admitting: Family Medicine

## 2021-10-16 DIAGNOSIS — Z3A15 15 weeks gestation of pregnancy: Secondary | ICD-10-CM | POA: Insufficient documentation

## 2021-10-16 DIAGNOSIS — O368120 Decreased fetal movements, second trimester, not applicable or unspecified: Secondary | ICD-10-CM | POA: Insufficient documentation

## 2021-10-16 DIAGNOSIS — G4700 Insomnia, unspecified: Secondary | ICD-10-CM | POA: Insufficient documentation

## 2021-10-16 DIAGNOSIS — O99352 Diseases of the nervous system complicating pregnancy, second trimester: Secondary | ICD-10-CM | POA: Insufficient documentation

## 2021-10-16 DIAGNOSIS — O26892 Other specified pregnancy related conditions, second trimester: Secondary | ICD-10-CM | POA: Insufficient documentation

## 2021-10-16 NOTE — MAU Note (Signed)
..  Kimberly Poole [name] is a 35 y.o. at [redacted]w[redacted]d here in MAU reporting: no fetal movement since yesterday. Has not been able to sleep for 3 days because her medication was not refilled. Her headaches had gone away but now are back, because she has not slept.   Pain score: 0/10 Vitals:   10/16/21 2347  BP: 115/83  Pulse: 67  Resp: 19  Temp: 97.9 F (36.6 C)  SpO2: 100%     FHT:144 Lab orders placed from triage: none

## 2021-10-17 DIAGNOSIS — Z3A15 15 weeks gestation of pregnancy: Secondary | ICD-10-CM

## 2021-10-17 DIAGNOSIS — G4700 Insomnia, unspecified: Secondary | ICD-10-CM

## 2021-10-17 MED ORDER — HYDROXYZINE HCL 50 MG PO TABS
50.0000 mg | ORAL_TABLET | Freq: Four times a day (QID) | ORAL | Status: DC | PRN
  Administered 2021-10-17: 50 mg via ORAL
  Filled 2021-10-17 (×2): qty 1

## 2021-10-17 MED ORDER — HYDROXYZINE HCL 50 MG PO TABS
50.0000 mg | ORAL_TABLET | Freq: Every day | ORAL | 1 refills | Status: DC
Start: 2021-10-17 — End: 2021-11-14

## 2021-10-17 NOTE — MAU Provider Note (Signed)
History     CSN: [id number]  Arrival date and time: 10/16/21 2333   Event Date/Time   First Provider Initiated Contact with Patient 10/16/21 2357     35 y.o. G2P1001 @15 .0 wks presenting with decreased FM. Reports not feeling FM since she had a massage yesterday. Denies VB or abd pain. Also reports insomnia x3 days because she ran out of her Atarax.    OB History     Gravida  2   Para  1   Term  1   Preterm      AB      Living  1      SAB      IAB      Ectopic      Multiple      Live Births  1           Past Medical History:  Diagnosis Date   Anxiety    Depression    Gallstones    Hypotension    Kidney stones    Migraines     Past Surgical History:  Procedure Laterality Date   CESAREAN SECTION      Family History  Problem Relation Age of Onset   Lung disease Mother    Lung disease Father     Social History   Tobacco Use   Smoking status: Former    Types: Cigarettes    Quit date: 2020    Years since quitting: 3.5   Smokeless tobacco: Never  Vaping Use   Vaping Use: Never used  Substance Use Topics   Alcohol use: Not Currently    Comment: celebrations, stopped drinking alcohol 2022   Drug use: Not Currently    Types: Cocaine    Comment: last used 03/20/2017    Allergies:  Allergies  Allergen Reactions   Diclofenac Itching   Other Itching and Swelling    Nuts-causes itching tongue and swelling of mouth    Medications Prior to Admission  Medication Sig Dispense Refill Last Dose   acetaminophen-caffeine (EXCEDRIN TENSION HEADACHE) 500-65 MG TABS per tablet Take 2 tablets by mouth every 6 (six) hours as needed (headache). 60 tablet 0    B Complex Vitamins (VITAMIN B COMPLEX) TABS Take 1 tablet by mouth daily.      Calcium & Magnesium Carbonates (MYLANTA PO) Take 1 Dose by mouth as needed (as needed).      citalopram (CELEXA) 20 MG tablet Take 2 tablets (40 mg total) by mouth at bedtime. 60 tablet 0    dimenhyDRINATE  (DRAMAMINE) 50 MG tablet Take 50 mg by mouth every 8 (eight) hours as needed for nausea.      hydrOXYzine (ATARAX) 50 MG tablet Take 50 mg by mouth at bedtime.      pantoprazole (PROTONIX) 20 MG tablet Take 2 tablets (40 mg total) by mouth daily. 60 tablet 0    Prenatal Vit-Fe Fumarate-FA (PREPLUS) 27-1 MG TABS Take 1 tablet by mouth daily. 30 tablet 13    promethazine (PHENERGAN) 25 MG tablet Take 1 tablet (25 mg total) by mouth every 6 (six) hours as needed for nausea or vomiting. 30 tablet 1     Review of Systems  Gastrointestinal:  Negative for abdominal pain.  Genitourinary:  Negative for vaginal bleeding.   Physical Exam   Blood pressure 115/83, pulse 67, temperature 97.9 F (36.6 C), temperature source Oral, resp. rate 19, height 5\' 1"  (1.549 m), weight 75.2 kg, last menstrual period 06/18/2021, SpO2 100 %.  Physical  Exam Vitals and nursing note reviewed.  Constitutional:      General: She is not in acute distress.    Appearance: Normal appearance.  HENT:     Head: Normocephalic and atraumatic.  Cardiovascular:     Rate and Rhythm: Normal rate.  Pulmonary:     Effort: Pulmonary effort is normal. No respiratory distress.  Musculoskeletal:        General: Normal range of motion.     Cervical back: Normal range of motion.  Neurological:     General: No focal deficit present.     Mental Status: She is alert and oriented to person, place, and time.  Psychiatric:        Mood and Affect: Mood normal.        Behavior: Behavior normal.   FHT 144  No results found for this or any previous visit (from the past 24 hour(s)).  MAU Course  Procedures  MDM Pt reassured with good FHTs. Atarax ordered and refilled. Stable for discharge home.   Assessment and Plan   1. [redacted] weeks gestation of pregnancy   2. Insomnia, unspecified type    Discharge home [facility] as scheduled Rx Atarax  Allergies as of 10/17/2021       Reactions   Diclofenac Itching   Other Itching,  Swelling   Nuts-causes itching tongue and swelling of mouth        Medication List     TAKE these medications    citalopram 20 MG tablet Commonly known as: CELEXA Take 2 tablets (40 mg total) by mouth at bedtime.   dimenhyDRINATE 50 MG tablet Commonly known as: DRAMAMINE Take 50 mg by mouth every 8 (eight) hours as needed for nausea.   Excedrin Tension Headache 500-65 MG Tabs per tablet Generic drug: acetaminophen-caffeine Take 2 tablets by mouth every 6 (six) hours as needed (headache).   hydrOXYzine 50 MG tablet Commonly known as: ATARAX Take 1 tablet (50 mg total) by mouth at bedtime.   MYLANTA PO Take 1 Dose by mouth as needed (as needed).   pantoprazole 20 MG tablet Commonly known as: PROTONIX Take 2 tablets (40 mg total) by mouth daily.   PrePLUS 27-1 MG Tabs Take 1 tablet by mouth daily.   promethazine 25 MG tablet Commonly known as: PHENERGAN Take 1 tablet (25 mg total) by mouth every 6 (six) hours as needed for nausea or vomiting.   Vitamin B Complex Tabs Take 1 tablet by mouth daily.         Donette Larry, CNM 10/17/2021, 12:16 AM

## 2021-10-24 ENCOUNTER — Encounter: Payer: Self-pay | Admitting: Obstetrics and Gynecology

## 2021-10-24 ENCOUNTER — Encounter: Payer: Self-pay | Admitting: Family Medicine

## 2021-10-24 NOTE — Progress Notes (Deleted)
History:   Kimberly Poole is a 36 y.o. G2P1001 at [redacted]w[redacted]d by LMP being seen today for her first obstetrical visit.   Patient {does/does not:19097} intend to breast feed.   Pregnancy history fully reviewed. Obstetrical history is significant for c-section at term for arrest of dilation after laboring for 3 days.   Patient reports {sx:14538}.      HISTORY: OB History  Gravida Para Term Preterm AB Living  2 1 1  0 0 1  SAB IAB Ectopic Multiple Live Births  0 0 0 0 1    # Outcome Date GA Lbr Len/2nd Weight Sex Delivery Anes PTL Lv  2 Current           1 Term 06/16/07   6 lb 9.8 oz (3 kg) M CS-LTranv Spinal  LIV     Birth Comments: Had bleeding often in pregnancy, was on bedrest and in hospital often. Had tachycarida. C/S due to failure to dilate,labored x 3 days,  then found nuchal cord Born in 06/18/07       Past Medical History:  Diagnosis Date   Anxiety    Depression    Gallstones    Hypotension    Kidney stones    Migraines    Past Surgical History:  Procedure Laterality Date   CESAREAN SECTION     Family History  Problem Relation Age of Onset   Lung disease Mother    Lung disease Father    Social History   Tobacco Use   Smoking status: Former    Types: Cigarettes    Quit date: 2020    Years since quitting: 3.6   Smokeless tobacco: Never  Vaping Use   Vaping Use: Never used  Substance Use Topics   Alcohol use: Not Currently    Comment: celebrations, stopped drinking alcohol 2022   Drug use: Not Currently    Types: Cocaine    Comment: last used 03/20/2017   Allergies  Allergen Reactions   Diclofenac Itching   Other Itching and Swelling    Nuts-causes itching tongue and swelling of mouth   Current Outpatient Medications on File Prior to Visit  Medication Sig Dispense Refill   acetaminophen-caffeine (EXCEDRIN TENSION HEADACHE) 500-65 MG TABS per tablet Take 2 tablets by mouth every 6 (six) hours as needed (headache). 60 tablet 0   B  Complex Vitamins (VITAMIN B COMPLEX) TABS Take 1 tablet by mouth daily.     Calcium & Magnesium Carbonates (MYLANTA PO) Take 1 Dose by mouth as needed (as needed).     citalopram (CELEXA) 20 MG tablet Take 2 tablets (40 mg total) by mouth at bedtime. 60 tablet 0   dimenhyDRINATE (DRAMAMINE) 50 MG tablet Take 50 mg by mouth every 8 (eight) hours as needed for nausea.     hydrOXYzine (ATARAX) 50 MG tablet Take 1 tablet (50 mg total) by mouth at bedtime. 30 tablet 1   pantoprazole (PROTONIX) 20 MG tablet Take 2 tablets (40 mg total) by mouth daily. 60 tablet 0   Prenatal Vit-Fe Fumarate-FA (PREPLUS) 27-1 MG TABS Take 1 tablet by mouth daily. 30 tablet 13   promethazine (PHENERGAN) 25 MG tablet Take 1 tablet (25 mg total) by mouth every 6 (six) hours as needed for nausea or vomiting. 30 tablet 1   No current facility-administered medications on file prior to visit.    Review of Systems Pertinent items noted in HPI and remainder of comprehensive ROS otherwise negative.  Physical Exam:  There were  no vitals filed for this visit.   ***Bedside Ultrasound for FHR check: Viable intrauterine pregnancy with positive cardiac activity noted, fetal heart rate ***bpm  Patient informed that the ultrasound is considered a limited obstetric ultrasound and is not intended to be a complete ultrasound exam.  Patient also informed that the ultrasound is not being completed with the intent of assessing for fetal or placental anomalies or any pelvic abnormalities.  Explained that the purpose of today's ultrasound is to assess for fetal heart rate.  Patient acknowledges the purpose of the exam and the limitations of the study.  General: well-developed, well-nourished female in no acute distress  Breasts:  normal appearance, no masses or tenderness bilaterally  Skin: normal coloration and turgor, no rashes  Neurologic: oriented, normal, negative, normal mood  Extremities: normal strength, tone, and muscle mass, ROM  of all joints is normal  HEENT PERRLA, extraocular movement intact and sclera clear, anicteric  Neck supple and no masses  Cardiovascular: regular rate and rhythm  Respiratory:  no respiratory distress, normal breath sounds  Abdomen: soft, non-tender; bowel sounds normal; no masses,  no organomegaly  Pelvic: normal external genitalia, no lesions, normal vaginal mucosa, normal vaginal discharge, normal cervix, ***pap smear done. Uterine size:      Assessment:    Pregnancy: G2P1001 Patient Active Problem List   Diagnosis Date Noted   Supervision of high risk pregnancy, antepartum 09/29/2021   AMA (advanced maternal age) multigravida 35+ 09/29/2021   History of drug overdose 09/29/2021   History of drug use 09/29/2021   Migraines    Depression    Language barrier, cultural differences 09/16/2021     Plan:    1. History of drug use Last use was in 2019  2. Antepartum multigravida of advanced maternal age  50. Supervision of high risk pregnancy, antepartum Initial labs drawn. Pap with HPV done today.  Discussed desired MOD: *** Continue prenatal vitamins. Problem list reviewed and updated. Genetic Screening discussed, Quad screen and NIPS: {requests/ordered/declines:14581}. Ultrasound discussed; fetal anatomic survey: scheduled . Anticipatory guidance about prenatal visits given including labs, ultrasounds, and testing. Discussed usage of Babys[employer]s and virtual visits  4. Language barrier, cultural differences Interpreter used throughout  5. Other migraine without status migrainosus, not intractable ***     The nature of [facility] with multiple MDs and Advanced Practice Providers was explained to patient; also emphasized that residents, students are part of our team. Routine obstetric precautions reviewed. Encouraged to seek out care at office or emergency room [facility] MAU preferred) for urgent and/or emergent concerns. No  follow-ups on file.    Milas Hock, MD, FACOG Obstetrician & Gynecologist, [facility] for [facility], [facility]

## 2021-10-25 ENCOUNTER — Institutional Professional Consult (permissible substitution): Payer: Self-pay

## 2021-11-14 ENCOUNTER — Ambulatory Visit: Payer: Self-pay | Admitting: *Deleted

## 2021-11-14 ENCOUNTER — Encounter: Payer: Self-pay | Admitting: *Deleted

## 2021-11-14 ENCOUNTER — Other Ambulatory Visit: Payer: Self-pay | Admitting: *Deleted

## 2021-11-14 ENCOUNTER — Ambulatory Visit (HOSPITAL_BASED_OUTPATIENT_CLINIC_OR_DEPARTMENT_OTHER): Payer: Self-pay | Admitting: Obstetrics

## 2021-11-14 ENCOUNTER — Ambulatory Visit: Payer: Self-pay | Attending: Student

## 2021-11-14 VITALS — BP 98/74 | HR 98

## 2021-11-14 DIAGNOSIS — O09522 Supervision of elderly multigravida, second trimester: Secondary | ICD-10-CM

## 2021-11-14 DIAGNOSIS — O99340 Other mental disorders complicating pregnancy, unspecified trimester: Secondary | ICD-10-CM | POA: Insufficient documentation

## 2021-11-14 DIAGNOSIS — O283 Abnormal ultrasonic finding on antenatal screening of mother: Secondary | ICD-10-CM

## 2021-11-14 DIAGNOSIS — F1991 Other psychoactive substance use, unspecified, in remission: Secondary | ICD-10-CM

## 2021-11-14 DIAGNOSIS — O0990 Supervision of high risk pregnancy, unspecified, unspecified trimester: Secondary | ICD-10-CM

## 2021-11-14 DIAGNOSIS — F32A Depression, unspecified: Secondary | ICD-10-CM

## 2021-11-14 DIAGNOSIS — Z3A19 19 weeks gestation of pregnancy: Secondary | ICD-10-CM

## 2021-11-14 DIAGNOSIS — Z9189 Other specified personal risk factors, not elsewhere classified: Secondary | ICD-10-CM

## 2021-11-14 DIAGNOSIS — O09529 Supervision of elderly multigravida, unspecified trimester: Secondary | ICD-10-CM | POA: Insufficient documentation

## 2021-11-14 DIAGNOSIS — O0932 Supervision of pregnancy with insufficient antenatal care, second trimester: Secondary | ICD-10-CM

## 2021-11-14 MED ORDER — HYDROXYZINE HCL 50 MG PO TABS: 50.0000 mg | ORAL_TABLET | Freq: Every day | ORAL | 1 refills | Status: DC

## 2021-11-14 MED ORDER — HYDROXYZINE HCL 25 MG PO TABS: 25.0000 mg | ORAL_TABLET | Freq: Every day | ORAL | 0 refills | Status: DC

## 2021-11-14 MED ORDER — CITALOPRAM HYDROBROMIDE 20 MG PO TABS: 40.0000 mg | ORAL_TABLET | Freq: Every day | ORAL | 0 refills | Status: DC

## 2021-11-14 NOTE — Progress Notes (Signed)
MFM Note  Kimberly Poole was seen for a detailed fetal anatomy scan due to advanced maternal age.  She has not initiated prenatal care yet.  She denies any problems in her current pregnancy.    She has not had a screening test drawn to screen for fetal chromosomal abnormalities.  She was informed that the fetal growth and amniotic fluid level were appropriate for her gestational age.   On today's exam, an increased nuchal fold of 0.7 cm was noted.  The association of an increased nuchal fold with Down syndrome was discussed.    The patient was also advised that the increased nuchal fold may be a normal variant, possibly related to the fetal position.  Due to advanced maternal age and the increased nuchal fold noted on today's exam, she was offered and declined a cell free DNA test to screen for Down syndrome.    She was also offered and declined an amniocentesis today for definitive diagnosis of Down syndrome.  The patient stated that she did not want to know whether or not the baby has Down syndrome.  She will wait until after delivery.  The fetal cardiac views were limited today due to the fetal position.  There were no obvious fetal cardiac defects suspected on today's exam.  Due to the increased nuchal fold, we will continue to follow her with growth ultrasounds throughout her pregnancy.    A follow-up growth scan was scheduled in 4 weeks.  The patient stated that she understood everything that was discussed with her today and all of her questions were answered.    All conversations were held with the patient today with the help of a Spanish interpreter.  A total of 30 minutes was spent counseling and coordinating the care for this patient.  Greater than 50% of the time was spent in direct face-to-face contact.

## 2021-11-14 NOTE — Progress Notes (Signed)
Pt arrived to office today @ 10:15 and requested medication refill. I was asked to speak with her as she was crying and stated that her medication had been increased to twice daily however the pharmacy did not have this information and she had no remaining refills. Pt stated that she has anxiety and is depressed. I had an extensive conversation w/pt lasting 45 minutes using on-site interpreter Gardiner Ramus. Pt reported that she has been taking her Citalopram 40 mg daily in the morning. She has Hydroxyzine 50 mg @ bedtime prescribed however has been taking 100 mg because of inability to sleep. She requested refills on both medications and stated that a doctor had told her that the Hydroxyzine would be increased to twice daily. I was not able to find any notes to support this claim of increased dose. I advised pt that she cannot take 100 mg of Hydroxyzine because this is an unsafe dose for her and her baby. Per consult w/Dr. Alvester Morin, I advised that I can refill the Hydroxyzine 50 mg @ bedtime only and also send in Rx for 25 mg which can be taken in the morning. I again emphasized that she can only take the dose of 50 mg @ bedtime. Rx for Citalopram will be refilled and starting today, she needs to take this medication @ bedtime as prescribed instead of morning. Pt was advised that she may take OTC Unisom to help with sleep. She should discuss all medications with provider @ her scheduled visit on 9/5. Per chart review, pt had missed appts with Behavioral Health Clinician in this office. I rescheduled appt for [facility] on 8/31 @ 10:15 in our office and pt was also reminded of 24 hr availability @ [facility] across the street from our office for urgent issues. She denies suicidal thoughts or thoughts of wanting to hurt others @ present. She voiced understanding of all information and instructions given. Upon pt leaving, she was provided with application for financial assistance as she is not eligible for Medicaid.

## 2021-11-15 NOTE — BH Specialist Note (Unsigned)
Integrated Behavioral Health Initial In-Person Visit  MRN: [id number] Name: Kimberly Poole  Number of Integrated Behavioral Health Clinician visits: No data recorded Session Start time: No data recorded   Session End time: No data recorded Total time in minutes: No data recorded  Types of Service: {CHL AMB TYPE OF SERVICE:[phone]}  Interpretor:Yes.   Interpretor Name and Language: Spanish, ***, ***   Warm Hand Off Completed.        Subjective: Kimberly Poole is a 36 y.o. female accompanied by {CHL AMB ACCOMPANIED CB:[id number]} Patient was referred by Scheryl Darter, MD for positive depression screening. Patient reports the following symptoms/concerns: *** Duration of problem: ***; Severity of problem: {Mild/Moderate/Severe:20260}  Objective: Mood: {BHH MOOD:22306} and Affect: {BHH AFFECT:22307} Risk of harm to self or others: {CHL AMB BH Suicide Current Mental Status:21022748}  Life Context: Family and Social: *** School/Work: *** Self-Care: *** Life Changes: Current pregnancy ***  Patient and/or Family's Strengths/Protective Factors: {CHL AMB BH PROTECTIVE FACTORS:[id number]}  Goals Addressed: Patient will: Reduce symptoms of: {IBH Symptoms:21014056} Increase knowledge and/or ability of: {IBH Patient Tools:21014057}  Demonstrate ability to: {IBH Goals:21014053}  Progress towards Goals: {CHL AMB BH PROGRESS TOWARDS [phone]}  Interventions: Interventions utilized: {IBH Interventions:21014054}  Standardized Assessments completed: {IBH Screening Tools:21014051}  Patient and/or Family Response: Patient agrees with treatment plan.   Patient Centered Plan: Patient is on the following Treatment Plan(s):  IBH  Assessment: Patient currently experiencing ***.   Patient may benefit from psychoeducation and brief therapeutic interventions regarding coping with symptoms of *** .  Plan: Follow up with behavioral health  clinician on : *** Behavioral recommendations:  -*** -*** Referral(s): {IBH Referrals:21014055}  Kimberly Close Sylva Overley, LCSW

## 2021-11-17 ENCOUNTER — Ambulatory Visit: Payer: Self-pay | Admitting: Clinical

## 2021-11-17 ENCOUNTER — Other Ambulatory Visit: Payer: Self-pay

## 2021-11-17 DIAGNOSIS — F339 Major depressive disorder, recurrent, unspecified: Secondary | ICD-10-CM

## 2021-11-17 DIAGNOSIS — Z658 Other specified problems related to psychosocial circumstances: Secondary | ICD-10-CM

## 2021-11-17 NOTE — Patient Instructions (Signed)
[facility] at [facility] [address] [place], [place] [address] [phone] (main office) [phone] [facility] office)  [employer] [phone] [email or web address]   Postpartum Support Nurse, [email or web address]

## 2021-11-22 ENCOUNTER — Encounter: Payer: Self-pay | Admitting: Advanced Practice Midwife

## 2021-11-22 ENCOUNTER — Other Ambulatory Visit: Payer: Self-pay

## 2021-11-22 ENCOUNTER — Ambulatory Visit (INDEPENDENT_AMBULATORY_CARE_PROVIDER_SITE_OTHER): Payer: Self-pay | Admitting: Advanced Practice Midwife

## 2021-11-22 VITALS — BP 114/77 | HR 94 | Wt 165.9 lb

## 2021-11-22 DIAGNOSIS — K219 Gastro-esophageal reflux disease without esophagitis: Secondary | ICD-10-CM

## 2021-11-22 DIAGNOSIS — F1991 Other psychoactive substance use, unspecified, in remission: Secondary | ICD-10-CM

## 2021-11-22 DIAGNOSIS — O99612 Diseases of the digestive system complicating pregnancy, second trimester: Secondary | ICD-10-CM

## 2021-11-22 DIAGNOSIS — O219 Vomiting of pregnancy, unspecified: Secondary | ICD-10-CM

## 2021-11-22 DIAGNOSIS — O34219 Maternal care for unspecified type scar from previous cesarean delivery: Secondary | ICD-10-CM

## 2021-11-22 DIAGNOSIS — O283 Abnormal ultrasonic finding on antenatal screening of mother: Secondary | ICD-10-CM

## 2021-11-22 DIAGNOSIS — Z3A20 20 weeks gestation of pregnancy: Secondary | ICD-10-CM

## 2021-11-22 DIAGNOSIS — O0990 Supervision of high risk pregnancy, unspecified, unspecified trimester: Secondary | ICD-10-CM

## 2021-11-22 MED ORDER — PANTOPRAZOLE SODIUM 20 MG PO TBEC: 40.0000 mg | DELAYED_RELEASE_TABLET | Freq: Every day | ORAL | 3 refills | Status: DC

## 2021-11-22 NOTE — Progress Notes (Signed)
INITIAL PRENATAL VISIT  Subjective:   Kimberly Poole is 36 y.o. G2P1001 female being seen today for her first obstetrical visit. She is not received other prenatal care previously this pregnancy. This is a planned pregnancy. This is a desired pregnancy.  She is at [redacted]w[redacted]d gestation by early [place] Her obstetrical history is significant for previous C/S . Relationship with FOB: significant other, living together. Patient does intend to breast feed. Pregnancy history fully reviewed.  Review of Systems:   Positive for heartburn, dysuria that improved with increasing fluids and groin pain through pregnancy. Positive for depression and anxiety RE: Dx thickened NT on [place].  no bleeding and no leaking.  Objective:    Obstetric History OB History  Gravida Para Term Preterm AB Living  2 1 1     1   SAB IAB Ectopic Multiple Live Births          1    # Outcome Date GA Lbr Len/2nd Weight Sex Delivery Anes PTL Lv  2 Current           1 Term 06/16/07   6 lb 9.8 oz (3 kg) M CS-LTranv Spinal  LIV     Birth Comments: Had bleeding often in pregnancy, was on bedrest and in hospital often. Had tachycarida. C/S due to failure to dilate,labored x 3 days,  then found nuchal cord Born in 06/18/07    Past Medical History:  Diagnosis Date   Anxiety    Depression    Gallstones    Hypotension    Kidney stones    Migraines     Past Surgical History:  Procedure Laterality Date   CESAREAN SECTION      Current Outpatient Medications on File Prior to Visit  Medication Sig Dispense Refill   citalopram (CELEXA) 20 MG tablet Take 2 tablets (40 mg total) by mouth at bedtime. 60 tablet 0   hydrOXYzine (ATARAX) 25 MG tablet Take 1 tablet (25 mg total) by mouth daily with breakfast. 30 tablet 0   hydrOXYzine (ATARAX) 50 MG tablet Take 1 tablet (50 mg total) by mouth at bedtime. 30 tablet 1   pantoprazole (PROTONIX) 20 MG tablet Take 2 tablets (40 mg total) by mouth daily. 60 tablet 0   Prenatal  Vit-Fe Fumarate-FA (PREPLUS) 27-1 MG TABS Take 1 tablet by mouth daily. 30 tablet 13   promethazine (PHENERGAN) 25 MG tablet Take 1 tablet (25 mg total) by mouth every 6 (six) hours as needed for nausea or vomiting. 30 tablet 1   acetaminophen-caffeine (EXCEDRIN TENSION HEADACHE) 500-65 MG TABS per tablet Take 2 tablets by mouth every 6 (six) hours as needed (headache). (Patient not taking: Reported on 11/14/2021) 60 tablet 0   B Complex Vitamins (VITAMIN B COMPLEX) TABS Take 1 tablet by mouth daily. (Patient not taking: Reported on 11/22/2021)     Calcium & Magnesium Carbonates (MYLANTA PO) Take 1 Dose by mouth as needed (as needed). (Patient not taking: Reported on 11/22/2021)     dimenhyDRINATE (DRAMAMINE) 50 MG tablet Take 50 mg by mouth every 8 (eight) hours as needed for nausea. (Patient not taking: Reported on 11/22/2021)     No current facility-administered medications on file prior to visit.    Allergies  Allergen Reactions   Diclofenac Itching   Other Itching and Swelling    Nuts-causes itching tongue and swelling of mouth    Social History:  reports that she quit smoking about 3 years ago. Her smoking use included cigarettes. She  has never used smokeless tobacco. She reports that she does not currently use alcohol. She reports that she does not currently use drugs after having used the following drugs: Cocaine.  Family History  Problem Relation Age of Onset   Lung disease Mother    Lung disease Father     The following portions of the patient's history were reviewed and updated as appropriate: allergies, current medications, past family history, past medical history, past social history, past surgical history and problem list.  Physical Exam:  BP 114/77   Pulse 94   Wt 165 lb 14.4 oz (75.3 kg)   LMP 06/18/2021   BMI 31.35 kg/m  CONSTITUTIONAL: Well-developed, well-nourished female in no acute distress.  HENT:  Normocephalic, atraumatic. Oropharynx is clear and moist EYES:  Conjunctivae normal. No scleral icterus.  SKIN: Skin is warm and dry. No rash noted. Not diaphoretic. No erythema. No pallor. MUSCULOSKELETAL: Normal range of motion. No tenderness.  No cyanosis, clubbing, or edema.   NEUROLOGIC: Alert and oriented to person, place, and time. Normal muscle tone coordination.  PSYCHIATRIC: Normal mood and affect. Normal behavior. Normal judgment and thought content. CARDIOVASCULAR: Normal heart rate noted. RESPIRATORY: Effort and rate normal. BREASTS: Declined ABDOMEN: Soft, no distention, tenderness, rebound or guarding. Fundal ht: 21 PELVIC: Normal appearing external genitalia; normal appearing vaginal mucosa and cervix.  No abnormal discharge noted.  Pap smear not obtained.  Uterus S=D, no other palpable masses, no uterine or adnexal tenderness. Fetal Status: Fetal Heart Rate (bpm): 143   Movement: Present     Indications for ASA therapy (per uptodate) One of the following: Previous pregnancy with preeclampsia, especially early onset and with an adverse outcome No Multifetal gestation No Chronic hypertension No Type 1 or 2 diabetes mellitus No Chronic kidney disease No Autoimmune disease (antiphospholipid syndrome, systemic lupus erythematosus) No  Two or more of the following: Nulliparity No Obesity (body mass index >30 kg/m2) Yes Family history of preeclampsia in mother or sister No Age ?35 years Yes Sociodemographic characteristics (African American race, low socioeconomic level) Yes Personal risk factors (eg, previous pregnancy with low birth weight or small for gestational age infant, previous adverse pregnancy outcome [eg, stillbirth], interval >10 years between pregnancies) Yes  Indications for early GDM screening  First-degree relative with diabetes No BMI >30kg/m2 Yes Age > 35 Yes Previous birth of an infant weighing ?4000 g No Gestational diabetes mellitus in a previous pregnancy No Glycated hemoglobin ?5.7 percent (39 mmol/mol),  impaired glucose tolerance, or impaired fasting glucose on previous testing No High-risk race/ethnicity (eg, African American, Latino, Native American, [place] American, Pacific Islander) Yes Previous stillbirth of unknown cause No Maternal birthweight > 9 lbs No History of cardiovascular disease No Hypertension or on therapy for hypertension No High-density lipoprotein cholesterol level <35 mg/dL (6.72 mmol/L) and/or a triglyceride level >250 mg/dL (0.94 mmol/L) No Polycystic ovary syndrome No Physical inactivity Yes Other clinical condition associated with insulin resistance (eg, severe obesity, acanthosis nigricans) No Current use of glucocorticoids No  Early screening tests: FBS, A1C, Random CBG, glucose challenge   Assessment:   Pregnancy: G2P1001 1. Supervision of high risk pregnancy, antepartum  - CBC/D/Plt+RPR+Rh+ABO+RubIgG... - Hemoglobin A1c - Culture, OB Urine  2. Previous cesarean delivery affecting pregnancy - Plan repeat w/ BTL. TOLAC info given.   3. Nausea and vomiting during pregnancy  - pantoprazole (PROTONIX) 20 MG tablet; Take 2 tablets (40 mg total) by mouth daily.  Dispense: 60 tablet; Refill: 3  4. Gastroesophageal reflux during pregnancy in  second trimester, antepartum  - pantoprazole (PROTONIX) 20 MG tablet; Take 2 tablets (40 mg total) by mouth daily.  Dispense: 60 tablet; Refill: 3  5. Nuchal fold thickening on prenatal ultrasound - F/U US - Declined genetics. MFM consult completed  6. History of drug use - Tox-assure at [place]  7. Depression - Continue w/ [facility], Meds   Plan:  Initial labs drawn/reviewed. Prenatal vitamins. Rx ASA for reduction of risk for preeclampsia.  Problem list reviewed and updated. Genetic screening discussed: NIPS/First trimester screen/Quad/AFP declined. Role of ultrasound in pregnancy discussed; Anatomy US: results reviewed. Amniocentesis discussed: declined. Follow up in 4 weeks. (Traditional/Centering/Mom-Baby  Combined Care) Discussed clinic routines, schedule of care and testing, genetic screening options, involvement of students and residents under the direct supervision of APPs and doctors and presence of female providers. Pt verbalized understanding.  [place], CNM 11/22/2021 10:59 AM

## 2021-11-22 NOTE — Patient Instructions (Addendum)
AREA PEDIATRIC/FAMILY PRACTICE PHYSICIANS  Central/Southeast [place] ([address]) [facility] Chambliss, MD; Eniola, MD; Hale, MD; Hensel, MD; McDiarmid, MD; McIntyer, MD; Neal, MD; Walden, MD [address] [place], [place] [address] [phone] Mon-Fri 8:30-12:30, 1:30-5:00 Providers come to see babies at [facility] Accepting Medicaid [facility] providers who accept newborns: Koirala, MD; Morrow, MD; Wolters, MD [address], [place], [place] [address] [phone] Mon-Fri 8:00-5:30 Babies seen by providers at [facility] Does NOT accept Medicaid Please call early in hospitalization for appointment (limited availability)  [facility] Mulberry, MD [address] [place], [place] [address] [phone] Mon, Tue, Thur, Fri 8:30-5:00, Wed 10:00-7:00 (closed 1-2pm) Babies seen by [facility] providers Accepting Medicaid Rubin - Pediatrician Rubin, MD [address], [place], [place] [address] [phone] Mon-Fri 8:30-5:00, Sat 8:30-12:00 Provider comes to see babies at [facility] Accepting Medicaid Must have been referred from current patients or contacted office prior to delivery [facility] ([facility]) Brown, MD; Chandler, MD; Ettefagh, MD; Grant, MD; Lester, MD; McCormick, MD; McQueen, MD; Prose, MD; Simha, MD; Stanley, MD; Stryffeler, NP; Tebben, NP [address], [place], [place] [address] [phone] Mon, Tue, Thur, Fri 8:30-5:30, Wed 9:30-5:30, Sat 8:30-12:30 Babies seen by [facility] providers Accepting Medicaid Only accepting infants of first-time parents or siblings of current patients Hospital discharge coordinator will make follow-up appointment Jack Amos [address], [place], [place]  [address] [phone]   Fax - [phone] [facility] [address], [place], [place]  [address] Phone - [phone]   Fax - [phone] Shilpa Gosrani [address], [place], [place]  [address] [phone]  East/Northeast [place] ([address]) [facility] Bates, MD; Brassfield, MD; Cooper, Cox, MD; MD; Davis, MD; Dovico, MD; Ettefaugh, MD; Little, MD; Lowe, MD; Keiffer, MD; Melvin, MD; Sumner, MD; Williams, MD [address], [place], [place] [address] [phone] Mon-Fri 8:30-5:00 (extended evenings Mon-Thur as needed), Sat-Sun 10:00-1:00 Providers come to see babies at [facility] Accepting Medicaid for families of first-time babies and families with all children in the household age 3 and under. Must register with office prior to making appointment (M-F only). [facility] Henson, NP; Knapp, MD; Lalonde, MD; Tysinger, PA [address] [place], [place] [address] [phone] Mon-Fri 8:00-5:00 Babies seen by providers at [facility] Does NOT accept Medicaid/Commercial Insurance Only [facility] ([facility])  Artis, MD; Barnes, MD; Bratton, MD; Coccaro, MD; Lockett Gardner, MD; Kramer, MD; Marshall, MD; Netherton, MD; Poleto, MD; Skinner, MD [address] [place], [place] [address] [phone] Mon-Fri 8:30-5:30, Sat (Oct.-Mar.) 9:00-1:00 Babies seen by providers at [facility] Accepting Medicaid  [place] ([address]) [facility] Reid, MD; Warner, MD [address], [place], [place] [address] [phone] Mon-Fri 8:30-5:00, Sat 8:30-12:00 Providers come to see babies at [facility] Does NOT accept Medicaid [facility] Becker, PA; Hagler, MD; Scifres, PA; Sun, MD; Swayne, MD [address], [place], [place] [address] [phone] Mon-Fri 8:00-5:00 Babies seen by providers at [facility] Does NOT accept Medicaid Only accepting babies of parents who  are patients Please call early in hospitalization for appointment (limited availability) [facility] Clark, MD; Frye, MD; Kelleher, MD; Mack, NP; Miller, MD; O'Keller, MD; Patterson, NP; Pudlo, MD; Puzio, MD; Thomas, MD; Tucker, MD; Twiselton, MD [address], [place], [place] [address] [phone] Mon-Fri 8:00-5:00, Sat 9:00-12:00 Providers come to see babies at [facility] Does NOT accept Medicaid  [place] ([address]) [facility] providers accepting new patients: Brake, NP; Wharton, PA [address], [place], [place] [address] [phone] Mon-Fri 8:00-5:00 Babies seen by providers at [facility] Does NOT accept Medicaid Only accepting babies of parents who are patients Please call early in hospitalization for appointment (limited availability) [facility] Gay, MD; Quinlan, MD [address] [place], [place] [address] [phone] (press 1 to schedule appointment) Mon-Fri 8:00-5:00 Providers come to see babies at [facility] Does NOT accept Medicaid [facility] Mazer, MD [address] [place], [place] [address] [phone] Mon-Fri 8:30-5:00 (lunch 12:30-1:00), extended hours by appointment only Wed 5:00-6:30 Babies seen by [facility] providers Accepting Medicaid [facility] Banks, MD; Jordan, MD; Koberlein, MD [address], [place], [place] [address] [phone] Mon-Fri 8:00-5:00 Babies seen by [facility] providers Does NOT accept Medicaid [facility] Parker, MD; Hunter, MD; Wallace, DO [address] [place], [place] [address] [phone] Mon-Fri 8:00-5:00 Babies seen by [facility] providers Does NOT accept Medicaid [facility] Brandon, PA; Brecken, PA; Christy, NP; Dees, MD; DeClaire, MD; DeWeese, MD; Hansen, NP; Mills, NP; Parrish, NP; Smoot, NP; Summer, MD; Vapne,  MD [address] [place], [place] [address] [phone] Mon-Fri 8:30-5:00, Sat 10:00-1:00 Providers come to see babies at [facility] Does NOT accept Medicaid Free prenatal information session Tuesdays at 4:45pm [facility] Bouska, MD; Gordon, PA; Jeffery, PA; Weber, PA [address] [place] [address] [phone] Mon-Fri 7:30-5:30 Babies seen by [facility] providers [facility] [address], [place], [place]  [address] [phone]   Fax - [phone]  [place] ([address] & [address]) [facility] [place] [address] [place], [place] [address] [phone] Mon-Thur 8:00-6:00 Providers come to see babies at [facility] Accepting Medicaid [facility] Anderson, NP; Badger, MD; Beal, PA; Spencer, PA [address] [place], [place] [address] [phone] Mon-Thur 7:30-7:30, Fri 7:30-4:30 Babies seen by [facility] providers Accepting Medicaid [facility] Agbuya, MD; Klett, NP; Romgoolam, MD [address], [place], [place] [address] [phone] Mon-Fri 8:30-5:00, Sat 8:30-12:00 Providers come to see babies at [facility] Accepting Medicaid Must have "Meet & Greet" appointment at office prior to delivery [facility] McCord, MD; Wallace, MD; Wood, MD [address], [place], [place] [address] [phone] Mon-Wed 8:00-6:00, Thur-Fri 8:00-5:00, Sat 9:00-12:00 Providers come to see babies at [facility] Does NOT accept Medicaid Only accepting siblings of current patients [facility]  [address], [place], [place]  [address] [phone]   Fax - [phone] [facility] [address], [place], [place]  [address] [phone]   Fax -  [phone]  [place] ([address] & [address]) [facility] Cirigliano, DO; Matthews, DO [address] , [place] [address] [phone] Mon-Fri 7:00-5:00 Babies seen by [facility] providers Does NOT accept Medicaid [facility] Briscoe, MD; Howley, PA; Moreira, PA [address], [place], [place] [address] [phone] Mon-Fri 8:00-5:00 Babies seen by [facility] providers Accepting Medicaid [facility] Boyd, MD; Church, PA; Jones, NP; Osborn, PA [address], , [place] [address] [phone] Mon-Fri 8:00-5:00 Babies seen by providers at [facility] Accepting Medicaid  [place] ([address]) [facility] [place] Wendling, DO [address] [place], [place] [address] [phone] Mon-Fri 8:00-5:00 Babies seen by [facility] providers Does NOT accept Medicaid Limited availability, please call early in hospitalization to schedule follow-up [facility] Calderon, PA; Cummings, MD; Dillard, MD; Martin, PA; Olson, MD; VanDeven, PA [address], [place], [place] [address] [phone] Mon-Fri 8:30-5:00, Sat 9:00-12:00 Babies seen by providers at [facility] Accepting Medicaid Please register online then schedule online or call office [email or web address]) Hunter, NP; Kumar, MD; Martin Rogers, PA [address], [place], [place] [address] [phone] Mon-Fri 8:00-5:00 Babies seen by providers at [facility] Accepting Medicaid [facility]) Glasgow, MD; Kristi Fleenor, NP; West, MD [address], [place], [place] [address] [phone] Mon-Fri 8:00-5:30, Sat&Sun by appointment (phones open at  8:30) Babies seen by [facility] providers Accepting Medicaid Must be a first-time baby or sibling of current patient [facility] [place]  [address], [place], [place]  [address] [phone]   Fax - [phone]  [place] ([address]) [facility] Brown, PA; Cowen, PA; Rice, MD; Helton, PA; Spry, MD [address] [place], [place] [address] [phone] Mon-Thur 8:00-7:00, Fri 8:00-5:00, Sat 8:00-12:00, Sun 9:00-12:00 Babies seen by [facility] providers Accepting Medicaid [facility] [place] Coe-Goins, MD; Marshall, MD; Pierre-Louis, MD [address], [place], [place] [address] [phone] Mon-Thur 8:00-5:00 Babies seen by providers at [facility] Accepting Medicaid [facility] Bratton, MD; Coe-Goins, MD; Hayes, MD; Lewis, MD; List, MD; Lott, MD; Marshall, MD; Moran, MD; O'Neal, MD; Pierre-Louis, MD; Pitonzo, MD; Scholer, MD; Spangle, MD [address] [place], [place] [address] [phone] Mon-Fri 8:00-5:30, Sat (Oct.-Mar.) 9:00-1:00 Babies seen by providers at [facility] Accepting Medicaid Must fill out new patient packet, available online at [email or web address] [facility] ([facility]) Friddle, NP; Harris, NP; Kelly, NP; Logan, MD; [place], PA; Poth, MD; Ramadoss, MD; Stanton, NP [address], [place], [place] [address] [phone] Mon-Thur 8:00-5:30, Fri 8:00-5:00 Babies seen by providers at [facility] Accepting Medicaid  [place] ([address]) [facility] Dixon, PA; Kalaheo, MD; Pickard, MD; Tapia, PA [address], [place], [place] [address] [phone] Mon-Fri 8:00-5:00 Babies seen by providers at [facility] Accepting Medicaid   [place] ([address]) [facility] [place] Masneri, DO; Meyers, MD; Nelson, PA [address], [place], [place] [address] [phone] Mon-Fri 8:00-5:00 Babies seen by providers at [facility] Does NOT accept Medicaid Limited appointment availability, please call early in hospitalization   [facility] [place] Kunedd, DO; McGowen, MD [address], [place], [place] [address] [phone] Mon-Fri 8:00-5:00 Babies seen by [facility] providers Does NOT accept Medicaid [facility] [place] Cameron, MD; MacDonald, MD; Michaels, PA; Nayak, MD [address] [place] [address], [place], [place] [address] [phone] Mon-Fri 8:00-5:00 After hours clinic ([address] [place], [place] [address]) [phone] Mon-Fri 5:00-8:00, Sat 12:00-6:00, Sun 10:00-4:00 Babies seen by [facility] providers Accepting Medicaid [facility] [place] [address] [place], [place]  [address] [phone]   Fax - [phone]  [place] [phone]) Adult nurse [facility], MD [address] [place], [place], [place] [address] [phone] Mon-Fri 8:00-5:00 Babies seen by [facility] providers Does NOT accept Medicaid [facility] [place] [facility] [place]) Rene Kocher, MD [address], [place], [place] [address] [phone] Mon-Thur 8:00-7:00, Fri 8:00-5:00, Sat 8:00-12:00 Babies seen by providers at [facility] Accepting Medicaid - but does not have vaccinations in office (must be received elsewhere) Limited availability, please call early in hospitalization  [place] [phone]) [facility]  Wyvonne Lenz, MD [address] [place] [address] [phone]  Fax [phone]  [facility]  Lyndel Safe, MD, [place], [place], [place], [place] [address] [place], [place]  [address] [phone] [facility]  [address] Sherian Maroon [place], [place] [address] [phone] [address] [place], [place] [address] [phone] [facility] Office)  [facility] [address] [place], [place] [address] [phone] [facility] [address], [place], [place] [address] [phone] [facility] despus de un parto por cesrea Vaginal Birth After Cesarean Delivery  Un parto vaginal despus de un parto por cesrea (PVDC) significa dar a luz por la vagina despus de haber dado a luz anteriormente por medio de una intervencin quirrgica llamada cesrea. Es posible que el PVDC sea una alternativa segura para usted, segn su salud y Chief Technology Officer. Hable con su mdico acerca del PVDC desde comienzos del embarazo, de modo que pueda Google, beneficios y opciones. Hablar sobre estos temas desde el comienzo le permitir tener tiempo para Human resources officer. Qu aumenta las probabilidades de un PVDC satisfactorio? Estos factores aumentan sus probabilidades de tener un PVDC satisfactorio: Ha tenido una sola cesrea anterior. Tuvo una incisin transversal baja para la cesrea. Ha tenido un parto vaginal satisfactorio. Su trabajo de parto comienza de forma natural en su fecha de parto o antes. Usted y el beb han tenido un embarazo saludable. El beb est ubicado con la cabeza [place]. Ladell Heads ocurrir cuando llegue al centro de [place] o al hospital? Pollyann Savoy que se inicie el [place] de parto y haya sido admitida en el hospital o centro de [place], [place] equipo de atencin mdica podr hacer lo siguiente: Revisar sus antecedentes de Psychiatrist y atender cualquier inquietud que usted pueda tener. Hablar con usted sobre su plan de nacimiento y Chiropractor las opciones para Human resources officer. Controlar su presin arterial, frecuencia respiratoria y frecuencia cardaca. Verificar el latido cardaco del  beb. Monitorear el tero para Tree surgeon. Verificar si la bolsa de agua (saco amnitico) se ha roto (ruptura). Colocarle una va intravenosa en una de las venas. Puede recibir lquidos y [employer] a travs de la va intravenosa. Monitoreo y exmenes Su equipo de atencin mdica puede Chief Operating Officer sus contracciones (monitoreo uterino) y Print production planner cardaca del beb (monitoreo fetal). Es posible que la deban monitorear durante largos perodos de tiempo (de forma continua) con un dispositivo de monitoreo. Su mdico tambin puede Medical illustrator fsicos frecuentes. Estos pueden Warehouse manager cmo y dnde est ubicado el beb en el tero y controlar el cuello uterino para determinar si se est abriendo (dilatando) o afinando (borrando). Qu sucede durante el [place] de parto y [place]? [place] de parto y el parto normales se dividen en tres etapas: Etapa 1 Esta es la etapa ms larga del [place]  de parto. Durante esta etapa, sentir contracciones. En general, las contracciones son leves, infrecuentes e irregulares al principio. Se hacen ms fuertes, ms frecuentes (aproximadamente cada 2 o 3 minutos) y ms regulares a medida que avanza en esta etapa. La primera etapa finaliza cuando el cuello uterino est completamente dilatado hasta 4 pulgadas (10 cm) y borrado. Etapa 2 Esta etapa comienza una vez que el cuello uterino est completamente dilatado y borrado, y dura hasta el nacimiento del beb. En esta etapa, va a sentir ganas de pujar al beb fuera de la vagina. Puede sentir un dolor urente y por estiramiento, especialmente cuando la parte ms ancha de la cabeza del beb pasa a travs de la abertura vaginal (coronacin). Una vez que el beb nace, el cordn umbilical se pinzar y se cortar. Colocarn al beb sobre su pecho desnudo y [place] con [place] Pita abrigada. Si tiene Psychologist, prison and probation services, observe al beb para detectar seales de hambre, como el reflejo de bsqueda o  succin. Etapa 3 Esta etapa comienza inmediatamente despus de que el beb nace y finaliza despus de la expulsin de la placenta. Esta etapa puede durar de 5 a 30 minutos. Qu puedo esperar despus del Aleen Campi de parto y [place]? Una vez que termine el trabajo de [place], se los controlar a usted y al beb para Public librarian seguridad de que ambos estn sanos, y se los supervisar hasta que estn listos para irse a casa. Es posible que le recomienden que permanezca en la misma habitacin con su beb para fomentar un vnculo temprano y Administrator, Civil Service. Se le controlar y Engineer, maintenance (IT) el tero con regularidad (masaje fndico). Puede seguir recibiendo lquidos o medicamentos por va intravenosa. Tendr algo de inflamacin y dolor en el abdomen, la vagina y la zona de la piel entre la abertura vaginal y el ano (perineo). Si se le realiz una incisin cerca de la vagina (episiotoma) o si ha tenido Systems developer vaginal durante el parto, pueden usarse compresas fras para reducir Chief Technology Officer y la hinchazn. Siga estas instrucciones en su casa: Cuidado de la incisin Si tiene una incisin (episiotoma), siga las instrucciones del mdico acerca del cuidado de la incisin. Controle la zona de la incisin todos los [address] para detectar signos de infeccin. Est atenta a los siguientes signos: Enrojecimiento, hinchazn o dolor. Ms lquido [place]. Calor. Pus o mal olor. Si se lo indican, aplique hielo en la zona de la episiotoma. Para hacer esto: Ponga el hielo en una bolsa plstica. Coloque una toalla entre la piel y Copy. Aplique el hielo durante 20 minutos, 2 o 3 veces por da. Retire el hielo si la piel se pone de color rojo brillante. Esto es Intel. Si no puede sentir dolor, calor o fro, tiene un mayor riesgo de que se dae la zona. Actividad Retome sus actividades normales como se lo haya indicado el mdico. Pregntele al mdico qu actividades son seguras para usted. Evite estar  sentado durante largos perodos sin moverse. Levntese y camine un poco cada 1 a 2 horas. Instrucciones generales Use los medicamentos de venta libre y los recetados solamente como se lo haya indicado el mdico. No tome baos de inmersin, no nade ni use el jacuzzi hasta que el mdico lo autorice. Pregunte si puede ducharse. Delle Reining solo le permitan darse baos de [place]. Es normal tener hemorragia vaginal despus del [place]. Use un apsito sanitario para el sangrado vaginal y secrecin. Concurra a todas las visitas de seguimiento. Esto es  importante. Dnde buscar ms informacin American Pregnancy Association (Asociacin Americana del Embarazo): americanpregnancy.org [employer] Corporation of Obstetricians and Gynecologists (Colegio Estadounidense de Obstetras y Scientific laboratory technician): acog.org Resumen Un parto vaginal despus de un parto por cesrea (PVDC) significa dar a luz por la vagina despus de haber dado a luz anteriormente por medio de una intervencin quirrgica llamada cesrea. Una vez que se inicie el [place] de parto y haya sido admitida en el hospital o centro de [place], su equipo de atencin mdica podr revisar sus antecedentes de Psychiatrist y atender cualquier inquietud que usted tenga. Aunque la Harley[employer]Davidson de las mujeres pueden tener un PVDC satisfactorio, a veces es necesario tener otra cesrea. Concurra a todas las visitas de seguimiento. Esto es importante. Esta informacin no tiene Theme park manager el consejo del mdico. Asegrese de hacerle al mdico cualquier pregunta que tenga. Document Revised: 03/31/2020 Document Reviewed: 03/31/2020 Elsevier Patient Education  [address]  [address], [place], [place] [address] [phone] [facility] [address], [place], [place] [address] [phone] [facility] [address], [place], [place] [address] [phone] [facility] [address], [place], [place]  [address] [phone] [facility]  [address] [place], [place] [address] [phone] Dr. Belia Heman. [place] [address] [place], [place] [address] [phone] [facility] [address], [place], [place] [address] [phone] [facility] [address] [place], [place] [address] [phone]

## 2021-11-23 LAB — CBC/D/PLT+RPR+RH+ABO+RUBIGG...
Antibody Screen: NEGATIVE
Basophils Absolute: 0 10*3/uL (ref 0.0–0.2)
Basos: 1 %
EOS (ABSOLUTE): 0.1 10*3/uL (ref 0.0–0.4)
Eos: 1 %
HCV Ab: NONREACTIVE
HIV Screen 4th Generation wRfx: NONREACTIVE
Hematocrit: 36.7 % (ref 34.0–46.6)
Hemoglobin: 12 g/dL (ref 11.1–15.9)
Hepatitis B Surface Ag: NEGATIVE
Immature Grans (Abs): 0 10*3/uL (ref 0.0–0.1)
Immature Granulocytes: 1 %
Lymphocytes Absolute: 1.8 10*3/uL (ref 0.7–3.1)
Lymphs: 28 %
MCH: 29.1 pg (ref 26.6–33.0)
MCHC: 32.7 g/dL (ref 31.5–35.7)
MCV: 89 fL (ref 79–97)
Monocytes Absolute: 0.5 10*3/uL (ref 0.1–0.9)
Monocytes: 7 %
Neutrophils Absolute: 4.1 10*3/uL (ref 1.4–7.0)
Neutrophils: 62 %
Platelets: 267 10*3/uL (ref 150–450)
RBC: 4.12 x10E6/uL (ref 3.77–5.28)
RDW: 12.8 % (ref 11.7–15.4)
RPR Ser Ql: NONREACTIVE
Rh Factor: POSITIVE
Rubella Antibodies, IGG: 7.41 index (ref >0.99)
WBC: 6.5 10*3/uL (ref 3.4–10.8)

## 2021-11-23 LAB — HEMOGLOBIN A1C
Est. average glucose Bld gHb Est-mCnc: 100 mg/dL
Hgb A1c MFr Bld: 5.1 % (ref 4.8–5.6)

## 2021-11-23 LAB — HCV INTERPRETATION

## 2021-11-24 LAB — URINE CULTURE, OB REFLEX

## 2021-11-24 LAB — CULTURE, OB URINE

## 2021-11-30 NOTE — Progress Notes (Deleted)
Integrated Behavioral Health Initial In-Person Visit  MRN: [id number] Name: Alvetta Hidrogo  Number of Integrated Behavioral Health Clinician visits: 1- Initial Visit  Session Start time: 1029    Session End time: 1128  Total time in minutes: 59   Types of Service: Individual psychotherapy  Interpretor:Yes.   Interpretor Name and Language: Spanish ***   Warm Hand Off Completed.        Subjective: Mishelle Hassan is a 36 y.o. female accompanied by {CHL AMB ACCOMPANIED EG:3151761607} Patient was referred by Scheryl Darter, MD for positive depression screening. Patient reports the following symptoms/concerns: *** Duration of problem: ***; Severity of problem: {Mild/Moderate/Severe:20260}  Objective: Mood: {BHH MOOD:22306} and Affect: {BHH AFFECT:22307} Risk of harm to self or others: {CHL AMB BH Suicide Current Mental Status:21022748}  Life Context: Family and Social: *** School/Work: *** Self-Care: *** Life Changes: current pregnancy ***  Patient and/or Family's Strengths/Protective Factors: {CHL AMB BH PROTECTIVE [phone]}  Goals Addressed: Patient will: Reduce symptoms of: {IBH Symptoms:21014056} Increase knowledge and/or ability of: {IBH Patient Tools:21014057}  Demonstrate ability to: {IBH Goals:21014053}  Progress towards Goals: {CHL AMB BH PROGRESS TOWARDS [phone]}  Interventions: Interventions utilized: {IBH Interventions:21014054}  Standardized Assessments completed: {IBH Screening Tools:21014051}  Patient and/or Family Response: Patient agrees with treatment plan. ***   Patient Centered Plan: Patient is on the following Treatment Plan(s):  IBH  Assessment: Patient currently experiencing ***.   Patient may benefit from psychoeducation and brief therapeutic interventions regarding coping with symptoms of *** .  Plan: Follow up with behavioral health clinician on : *** Behavioral  recommendations:  -*** -*** Referral(s): {IBH Referrals:21014055}      11/22/2021   10:05 AM 11/17/2021    1:29 PM 09/29/2021   11:54 AM  GAD 7 : Generalized Anxiety Score  Nervous, Anxious, on Edge 2 2 3   Control/stop worrying 2 2 3   Worry too much - different things 2 2 3   Trouble relaxing 2 2 3   Restless 2 1 3   Easily annoyed or irritable 2 1 1   Afraid - awful might happen 2 2 1   Total GAD 7 Score 14 12 17         11/22/2021   10:05 AM 11/17/2021    1:28 PM 09/29/2021   11:46 AM  PHQ9 SCORE ONLY  PHQ-9 Total Score 9 11 18      

## 2021-12-01 ENCOUNTER — Ambulatory Visit: Payer: Self-pay

## 2021-12-13 ENCOUNTER — Ambulatory Visit: Payer: Self-pay | Admitting: *Deleted

## 2021-12-13 ENCOUNTER — Ambulatory Visit (INDEPENDENT_AMBULATORY_CARE_PROVIDER_SITE_OTHER): Payer: Self-pay

## 2021-12-13 ENCOUNTER — Ambulatory Visit: Payer: Self-pay | Attending: Obstetrics

## 2021-12-13 ENCOUNTER — Other Ambulatory Visit: Payer: Self-pay | Admitting: *Deleted

## 2021-12-13 VITALS — BP 103/65 | HR 86 | Wt 167.3 lb

## 2021-12-13 VITALS — BP 97/62 | HR 84

## 2021-12-13 DIAGNOSIS — Z9189 Other specified personal risk factors, not elsewhere classified: Secondary | ICD-10-CM | POA: Insufficient documentation

## 2021-12-13 DIAGNOSIS — G43809 Other migraine, not intractable, without status migrainosus: Secondary | ICD-10-CM | POA: Insufficient documentation

## 2021-12-13 DIAGNOSIS — O283 Abnormal ultrasonic finding on antenatal screening of mother: Secondary | ICD-10-CM

## 2021-12-13 DIAGNOSIS — F1991 Other psychoactive substance use, unspecified, in remission: Secondary | ICD-10-CM

## 2021-12-13 DIAGNOSIS — O358XX0 Maternal care for other (suspected) fetal abnormality and damage, not applicable or unspecified: Secondary | ICD-10-CM

## 2021-12-13 DIAGNOSIS — O09522 Supervision of elderly multigravida, second trimester: Secondary | ICD-10-CM

## 2021-12-13 DIAGNOSIS — O34219 Maternal care for unspecified type scar from previous cesarean delivery: Secondary | ICD-10-CM

## 2021-12-13 DIAGNOSIS — O0990 Supervision of high risk pregnancy, unspecified, unspecified trimester: Secondary | ICD-10-CM

## 2021-12-13 DIAGNOSIS — O0932 Supervision of pregnancy with insufficient antenatal care, second trimester: Secondary | ICD-10-CM | POA: Insufficient documentation

## 2021-12-13 DIAGNOSIS — Z3A23 23 weeks gestation of pregnancy: Secondary | ICD-10-CM

## 2021-12-13 LAB — POCT URINALYSIS DIP (DEVICE)
Bilirubin Urine: NEGATIVE
Glucose, UA: NEGATIVE mg/dL
Hgb urine dipstick: NEGATIVE
Ketones, ur: NEGATIVE mg/dL
Leukocytes,Ua: NEGATIVE
Nitrite: NEGATIVE
Protein, ur: NEGATIVE mg/dL
Specific Gravity, Urine: 1.025 (ref 1.005–1.030)
Urobilinogen, UA: 0.2 mg/dL (ref 0.0–1.0)
pH: 6 (ref 5.0–8.0)

## 2021-12-13 NOTE — Progress Notes (Signed)
Pt was seen at [facility] this AM for anatomy US and told to present to our office to leave urine sample for possible infection. Clean catch urine collected. UA is unremarkable. Pt reports increased urinary frequency. Reports recently experiencing left lower abdominal pain that is no longer present. Reports tightening throughout every day in stomach accompanied by "throbbing" in pelvic area. Patient states these symptoms are relieved by laying down and rubbing her stomach with lotion. Pt reports noticing this most often when she is tired or has been doing certain activities. Patient denies abnormal vaginal discharge or vaginal bleeding.   Reviewed with Len Blalock, CNM who states this is likely uterine irritability and recommends increased hydration. Reviewed provider recommendation with patient. Return precautions discussed with patient. Will return for routine prenatal visit on 12/20/21.  Spanish interpreter Rosemarie Ax present for entire encounter.  Apolonio Schneiders RN 12/13/21

## 2021-12-13 NOTE — Progress Notes (Signed)
Pain in lower abd last night and this am. She has urgency to urinate and she goes a little then a little more .

## 2021-12-14 ENCOUNTER — Ambulatory Visit: Payer: Self-pay

## 2021-12-14 ENCOUNTER — Telehealth: Payer: Self-pay | Admitting: Clinical

## 2021-12-14 NOTE — Telephone Encounter (Signed)
Integrated Behavioral Health Phone Note  MRN: [id number] NAME: Kimberly Poole  Time Call Initiated: 9:27 AM Time Call Completed: 10: 32 AM Total Call Time:  [place] Intern contacted pt regarding missed appointment using [employer] [phone]). Pt requested to be rescheduled due to being busy caring for a sick neighbor. [facility] Intern rescheduled pt for October 11th at [place]

## 2021-12-19 ENCOUNTER — Other Ambulatory Visit: Payer: Self-pay | Admitting: Family Medicine

## 2021-12-19 DIAGNOSIS — F32A Depression, unspecified: Secondary | ICD-10-CM

## 2021-12-20 ENCOUNTER — Encounter: Payer: Self-pay | Admitting: Family Medicine

## 2021-12-20 ENCOUNTER — Encounter: Payer: Self-pay | Admitting: Advanced Practice Midwife

## 2021-12-21 ENCOUNTER — Telehealth: Payer: Self-pay | Admitting: *Deleted

## 2021-12-21 ENCOUNTER — Other Ambulatory Visit: Payer: Self-pay | Admitting: *Deleted

## 2021-12-21 DIAGNOSIS — O99340 Other mental disorders complicating pregnancy, unspecified trimester: Secondary | ICD-10-CM

## 2021-12-21 DIAGNOSIS — F32A Depression, unspecified: Secondary | ICD-10-CM

## 2021-12-21 MED ORDER — CITALOPRAM HYDROBROMIDE 20 MG PO TABS: 40.0000 mg | ORAL_TABLET | Freq: Every day | ORAL | 0 refills | Status: DC

## 2021-12-21 NOTE — Telephone Encounter (Signed)
Voicemail message left by pt's significant other stating that she needs refill of Citalopram. I called pt w/[place] interpreter [phone] and discussed her concern.  Pt stated that she ran out of medication on 9/29 and did not have refill. She was without the drug until 10/2 when the pharmacy agreed to give her 3 doses until she could get a refill sent in. I advised that I will send refill today. Per chart review, pt DNKA yesterday for LOB. She stated that she had gotten confused due to having so many appointments. I advised that I will get the visit rescheduled and she will be notified of the appt.  She voiced understanding. Total conversation lasted 27 minutes.

## 2021-12-28 ENCOUNTER — Ambulatory Visit: Payer: Self-pay | Admitting: Clinical

## 2021-12-28 ENCOUNTER — Other Ambulatory Visit: Payer: Self-pay

## 2021-12-28 DIAGNOSIS — Z658 Other specified problems related to psychosocial circumstances: Secondary | ICD-10-CM
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
## 2021-12-29 ENCOUNTER — Ambulatory Visit (INDEPENDENT_AMBULATORY_CARE_PROVIDER_SITE_OTHER): Payer: Self-pay | Admitting: Certified Nurse Midwife

## 2021-12-29 VITALS — BP 113/77 | HR 88 | Wt 170.8 lb

## 2021-12-29 DIAGNOSIS — F1991 Other psychoactive substance use, unspecified, in remission: Secondary | ICD-10-CM

## 2021-12-29 DIAGNOSIS — O0990 Supervision of high risk pregnancy, unspecified, unspecified trimester: Secondary | ICD-10-CM

## 2021-12-29 DIAGNOSIS — Z3A25 25 weeks gestation of pregnancy: Secondary | ICD-10-CM

## 2021-12-29 DIAGNOSIS — N393 Stress incontinence (female) (male): Secondary | ICD-10-CM

## 2021-12-29 DIAGNOSIS — O0992 Supervision of high risk pregnancy, unspecified, second trimester: Secondary | ICD-10-CM

## 2021-12-29 DIAGNOSIS — Z98891 History of uterine scar from previous surgery: Secondary | ICD-10-CM

## 2021-12-29 DIAGNOSIS — Z603 Acculturation difficulty: Secondary | ICD-10-CM

## 2021-12-29 DIAGNOSIS — O09522 Supervision of elderly multigravida, second trimester: Secondary | ICD-10-CM

## 2021-12-29 NOTE — Progress Notes (Signed)
PRENATAL VISIT NOTE  Subjective:  Kimberly Poole is a 36 y.o. G2P1001 at [redacted]w[redacted]d being seen today for ongoing prenatal care.  She is currently monitored for the following issues for this high-risk pregnancy and has Language barrier, cultural differences; Supervision of high risk pregnancy, antepartum; AMA (advanced maternal age) multigravida 52+; History of drug overdose; History of drug use; Migraines; Depression; Previous cesarean delivery affecting pregnancy; and Nuchal fold thickening on prenatal ultrasound on their problem list.  Patient reports  leaking with coughing, or heavy lifting. Believes that it is urine. States she wears a panty liner, but denies saturating it. Denies odor or color.   .  Contractions: Not present. Vag. Bleeding: None.  Movement: Present. Denies leaking of fluid.   The following portions of the patient's history were reviewed and updated as appropriate: allergies, current medications, past family history, past medical history, past social history, past surgical history and problem list.   Objective:   Vitals:   12/29/21 1115  BP: 113/77  Pulse: 88  Weight: 170 lb 12.8 oz (77.5 kg)    Fetal Status: Fetal Heart Rate (bpm): 140   Movement: Present     General:  Alert, oriented and cooperative. Patient is in no acute distress.  Skin: Skin is warm and dry. No rash noted.   Cardiovascular: Normal heart rate noted  Respiratory: Normal respiratory effort, no problems with respiration noted  Abdomen: Soft, gravid, appropriate for gestational age.  Pain/Pressure: Present     Pelvic: Cervical exam deferred        Extremities: Normal range of motion.  Edema: None  Mental Status: Normal mood and affect. Normal behavior. Normal judgment and thought content.   Assessment and Plan:  Pregnancy: G2P1001 at [redacted]w[redacted]d 1. Supervision of high risk pregnancy, antepartum - Patient feeling frequent and vigorous fetal movement.  - PAP deferred until PP visit.    2. [redacted] weeks gestation of pregnancy - GTT at next visit. Discussed midnight intake restrictions for GT testing.  - Patient verbalized understanding.   3. History of C-section - Patient planning a Repeat C/s with a BTL  - Patient placed on MD schedule for BTL consent for next visit.   23. Multigravida of advanced maternal age in second trimester   5. History of drug use - Patient denies use.  - ToxASSURE Select 13 (MW), Urine Collected today   6. Language barrier, cultural differences - Due to language barrier, an interpreter was present during the history-taking and subsequent discussion (and for part of the physical exam) with this patient. - In person spanish interpreter Eda used for the duration of this visit.   7. Genuine stress incontinence, female  - Discussed the effects of multiple pregnancies on the pelvic floor and elasticity of the muscle.  - Recommended Pelvic floor therapy in postpartum period.   Preterm labor symptoms and general obstetric precautions including but not limited to vaginal bleeding, contractions, leaking of fluid and fetal movement were reviewed in detail with the patient. Please refer to After Visit Summary for other counseling recommendations.   Return for HROB w GTT.  Future Appointments  Date Time Provider [place]  01/11/2022 10:30 AM [facility] NURSE [facility]  01/11/2022 10:45 AM [facility] US6 [facility]  01/24/2022  8:15 AM Clarnce Flock, MD [facility]  01/24/2022  8:50 AM [facility] LAB [facility]  02/08/2022 10:30 AM [facility] NURSE [facility]  02/08/2022 10:45 AM [facility] US5 [facility] [place]   Venesha Petraitis Isaias Sakai) Rollene Rotunda, MSN, CNM  [facility] [place]  12/29/21 1:18 PM

## 2021-12-31 LAB — TOXASSURE SELECT 13 (MW), URINE

## 2022-01-11 ENCOUNTER — Ambulatory Visit: Payer: Self-pay | Attending: Maternal & Fetal Medicine

## 2022-01-11 ENCOUNTER — Ambulatory Visit: Payer: Self-pay | Admitting: *Deleted

## 2022-01-11 VITALS — BP 112/72 | HR 90

## 2022-01-11 DIAGNOSIS — F1991 Other psychoactive substance use, unspecified, in remission: Secondary | ICD-10-CM | POA: Insufficient documentation

## 2022-01-11 DIAGNOSIS — Z9189 Other specified personal risk factors, not elsewhere classified: Secondary | ICD-10-CM | POA: Insufficient documentation

## 2022-01-11 DIAGNOSIS — O0990 Supervision of high risk pregnancy, unspecified, unspecified trimester: Secondary | ICD-10-CM | POA: Insufficient documentation

## 2022-01-11 DIAGNOSIS — O09522 Supervision of elderly multigravida, second trimester: Secondary | ICD-10-CM

## 2022-01-11 DIAGNOSIS — O34219 Maternal care for unspecified type scar from previous cesarean delivery: Secondary | ICD-10-CM | POA: Insufficient documentation

## 2022-01-11 DIAGNOSIS — O0932 Supervision of pregnancy with insufficient antenatal care, second trimester: Secondary | ICD-10-CM | POA: Insufficient documentation

## 2022-01-11 DIAGNOSIS — O283 Abnormal ultrasonic finding on antenatal screening of mother: Secondary | ICD-10-CM | POA: Insufficient documentation

## 2022-01-18 ENCOUNTER — Other Ambulatory Visit: Payer: Self-pay | Admitting: Family Medicine

## 2022-01-18 DIAGNOSIS — O99340 Other mental disorders complicating pregnancy, unspecified trimester: Secondary | ICD-10-CM

## 2022-01-19 ENCOUNTER — Other Ambulatory Visit: Payer: Self-pay | Admitting: *Deleted

## 2022-01-19 DIAGNOSIS — O0990 Supervision of high risk pregnancy, unspecified, unspecified trimester: Secondary | ICD-10-CM

## 2022-01-23 NOTE — Telephone Encounter (Signed)
Patient needs refill of medication Citalopram ASAP does not have anymore

## 2022-01-24 ENCOUNTER — Encounter: Payer: Self-pay | Admitting: Family Medicine

## 2022-01-24 ENCOUNTER — Telehealth: Payer: Self-pay | Admitting: Family Medicine

## 2022-01-24 ENCOUNTER — Other Ambulatory Visit: Payer: Self-pay

## 2022-01-24 NOTE — Telephone Encounter (Signed)
Called patient to get her [facility] and 2hr GTT reschedule but there was no answer or voicemail

## 2022-01-25 ENCOUNTER — Other Ambulatory Visit: Payer: Self-pay

## 2022-01-25 ENCOUNTER — Inpatient Hospital Stay (HOSPITAL_COMMUNITY)
Admission: AD | Admit: 2022-01-25 | Discharge: 2022-01-25 | Disposition: A | Discharge status: Home / Self Care | Payer: Self-pay | Attending: Obstetrics and Gynecology | Admitting: Obstetrics and Gynecology

## 2022-01-25 ENCOUNTER — Encounter (HOSPITAL_COMMUNITY): Payer: Self-pay | Admitting: Obstetrics and Gynecology

## 2022-01-25 DIAGNOSIS — O26899 Other specified pregnancy related conditions, unspecified trimester: Secondary | ICD-10-CM

## 2022-01-25 DIAGNOSIS — Z3689 Encounter for other specified antenatal screening: Secondary | ICD-10-CM

## 2022-01-25 DIAGNOSIS — O26893 Other specified pregnancy related conditions, third trimester: Secondary | ICD-10-CM | POA: Insufficient documentation

## 2022-01-25 DIAGNOSIS — Z3A29 29 weeks gestation of pregnancy: Secondary | ICD-10-CM

## 2022-01-25 DIAGNOSIS — O09523 Supervision of elderly multigravida, third trimester: Secondary | ICD-10-CM | POA: Insufficient documentation

## 2022-01-25 DIAGNOSIS — R519 Headache, unspecified: Secondary | ICD-10-CM

## 2022-01-25 LAB — URINALYSIS, ROUTINE W REFLEX MICROSCOPIC
Bilirubin Urine: NEGATIVE
Glucose, UA: NEGATIVE mg/dL
Hgb urine dipstick: NEGATIVE
Ketones, ur: 20 mg/dL — AB
Leukocytes,Ua: NEGATIVE
Nitrite: NEGATIVE
Protein, ur: NEGATIVE mg/dL
Specific Gravity, Urine: 1.028 (ref 1.005–1.030)
pH: 5 (ref 5.0–8.0)

## 2022-01-25 LAB — AMNISURE RUPTURE OF MEMBRANE (ROM) NOT AT ARMC: Amnisure ROM: NEGATIVE

## 2022-01-25 MED ORDER — CAFFEINE 200 MG PO TABS
200.0000 mg | ORAL_TABLET | Freq: Once | ORAL | Status: AC
  Administered 2022-01-25: 200 mg via ORAL
  Filled 2022-01-25: qty 1

## 2022-01-25 MED ORDER — ACETAMINOPHEN-CAFFEINE 500-65 MG PO TABS: 2.0000 | ORAL_TABLET | Freq: Once | ORAL | Status: DC

## 2022-01-25 MED ORDER — CYCLOBENZAPRINE HCL 10 MG PO TABS: 10.0000 mg | ORAL_TABLET | Freq: Two times a day (BID) | ORAL | 0 refills | Status: DC | PRN

## 2022-01-25 MED ORDER — LACTATED RINGERS IV BOLUS
1000.0000 mL | Freq: Once | INTRAVENOUS | Status: AC
  Administered 2022-01-25: 1000 mL via INTRAVENOUS

## 2022-01-25 MED ORDER — BUTALBITAL-APAP-CAFFEINE 50-325-40 MG PO TABS
2.0000 | ORAL_TABLET | Freq: Once | ORAL | Status: AC
  Administered 2022-01-25: 2 via ORAL
  Filled 2022-01-25: qty 2

## 2022-01-25 NOTE — MAU Provider Note (Signed)
History     CSN: [id number]  Arrival date and time: 01/25/22 1738   Event Date/Time   First Provider Initiated Contact with Patient 01/25/22 1754      Chief Complaint  Patient presents with   Contractions   HPI Kimberly Poole is a 36 y.o. G2P1001 at [redacted]w[redacted]d who presents to MAU with chief complaint of contractions. This is a new complaint, onset yesterday and worsening over time. Pain score is 8/10 and patient is breathing rapidly to cope with them. She endorses being very active today. She denies aggravating or alleviating factors.   Leaking of fluid Patient endorses recurrent feeling of wetness in her underwear. She denies gush of fluid at onset.  Headache This is a recurrent problem, onset yesterday. Patient has attempted management with OTC Excedrin but has not experienced relief. Most recent administration was 1530 today.  Patient drove herself to hospital and does not have ride home.  Patient receives care with [place].  OB History     Gravida  2   Para  1   Term  1   Preterm      AB      Living  1      SAB      IAB      Ectopic      Multiple      Live Births  1           Past Medical History:  Diagnosis Date   Anxiety    Depression    Gallstones    Hypotension    Kidney stones    Migraines     Past Surgical History:  Procedure Laterality Date   CESAREAN SECTION      Family History  Problem Relation Age of Onset   Lung disease Mother    Lung disease Father     Social History   Tobacco Use   Smoking status: Former    Types: Cigarettes    Quit date: 2020    Years since quitting: 3.8   Smokeless tobacco: Never  Vaping Use   Vaping Use: Never used  Substance Use Topics   Alcohol use: Not Currently    Comment: celebrations, stopped drinking alcohol 2022   Drug use: Not Currently    Types: Cocaine    Comment: last used 03/20/2017    Allergies:  Allergies  Allergen Reactions   Diclofenac Itching   Other  Itching and Swelling    Nuts-causes itching tongue and swelling of mouth    Medications Prior to Admission  Medication Sig Dispense Refill Last Dose   citalopram (CELEXA) 20 MG tablet TAKE 2 TABLETS (40 MG TOTAL) BY MOUTH AT BEDTIME. 60 tablet 3 01/25/2022   hydrOXYzine (ATARAX) 25 MG tablet Take 1 tablet (25 mg total) by mouth daily with breakfast. 30 tablet 0 01/24/2022   acetaminophen-caffeine (EXCEDRIN TENSION HEADACHE) 500-65 MG TABS per tablet Take 2 tablets by mouth every 6 (six) hours as needed (headache). (Patient not taking: Reported on 11/14/2021) 60 tablet 0    Ascorbic Acid (VITAMIN C PO) Take by mouth.      B Complex Vitamins (VITAMIN B COMPLEX) TABS Take 1 tablet by mouth daily. (Patient not taking: Reported on 11/22/2021)      dimenhyDRINATE (DRAMAMINE) 50 MG tablet Take 50 mg by mouth every 8 (eight) hours as needed for nausea. (Patient not taking: Reported on 11/22/2021)      hydrOXYzine (ATARAX) 50 MG tablet Take 1 tablet (50 mg total) by  mouth at bedtime. (Patient not taking: Reported on 12/13/2021) 30 tablet 1    pantoprazole (PROTONIX) 20 MG tablet Take 2 tablets (40 mg total) by mouth daily. 60 tablet 3    Prenatal Vit-Fe Fumarate-FA (PREPLUS) 27-1 MG TABS Take 1 tablet by mouth daily. 30 tablet 13    promethazine (PHENERGAN) 25 MG tablet Take 1 tablet (25 mg total) by mouth every 6 (six) hours as needed for nausea or vomiting. (Patient not taking: Reported on 12/13/2021) 30 tablet 1     Review of Systems  Gastrointestinal:  Positive for abdominal pain.  Genitourinary:  Positive for vaginal discharge.  Neurological:  Positive for headaches.  All other systems reviewed and are negative.  Physical Exam   Blood pressure 105/72, pulse 88, temperature 98 F (36.7 C), resp. rate 20, last menstrual period 06/18/2021, SpO2 99 %.  Physical Exam Vitals and nursing note reviewed. Exam conducted with a chaperone present.  Constitutional:      Appearance: Normal appearance.   Cardiovascular:     Rate and Rhythm: Normal rate.  Pulmonary:     Effort: Pulmonary effort is normal.  Abdominal:     Comments: Gravid  Genitourinary:    Comments: Pelvic exam: External genitalia normal, vaginal walls pink and well rugated, cervix visually closed, no lesions noted. No abnormal discharge observed. Negative pooling.   Skin:    Capillary Refill: Capillary refill takes less than 2 seconds.  Neurological:     Mental Status: She is alert and oriented to person, place, and time.  Psychiatric:        Mood and Affect: Mood normal.        Behavior: Behavior normal.        Thought Content: Thought content normal.        Judgment: Judgment normal.     MAU Course  Procedures  MDM  --Reactive tracing: baseline 125, mod var, + accels, no decels --Toco: quiet --Intact membranes: negative pooling, negative fern, negative amnisure --No improvement in pain score s/p Fioricet. Patient does not have ride home and asks again if she can be given other medications as she lives very close to [facility]. CNM restates this is safety-based protocol. She can return to MAU at any time for additional management of her headache if she is able to secure a ride.  Orders Placed This Encounter  Procedures   Urinalysis, Routine w reflex microscopic Urine, Clean Catch   Amnisure rupture of membrane (rom)not at [facility]   Insert peripheral IV   Discharge patient   Patient Vitals for the past 24 hrs:  BP Temp Pulse Resp SpO2  01/25/22 2104 107/65 -- 75 -- --  01/25/22 1805 105/72 98 F (36.7 C) 88 20 99 %   Results for orders placed or performed during the hospital encounter of 01/25/22 (from the past 24 hour(s))  Amnisure rupture of membrane (rom)not at [facility]     Status: None   Collection Time: 01/25/22  6:24 PM  Result Value Ref Range   Amnisure ROM NEGATIVE   Urinalysis, Routine w reflex microscopic Urine, Clean Catch     Status: Abnormal   Collection Time: 01/25/22  6:42 PM  Result Value Ref  Range   Color, Urine YELLOW YELLOW   APPearance CLEAR CLEAR   Specific Gravity, Urine 1.028 1.005 - 1.030   pH 5.0 5.0 - 8.0   Glucose, UA NEGATIVE NEGATIVE mg/dL   Hgb urine dipstick NEGATIVE NEGATIVE   Bilirubin Urine NEGATIVE NEGATIVE   Ketones, ur  20 (A) NEGATIVE mg/dL   Protein, ur NEGATIVE NEGATIVE mg/dL   Nitrite NEGATIVE NEGATIVE   Leukocytes,Ua NEGATIVE NEGATIVE   Meds ordered this encounter  Medications   butalbital-acetaminophen-caffeine (FIORICET) 50-325-40 MG per tablet 2 tablet   lactated ringers bolus 1,000 mL   DISCONTD: acetaminophen-caffeine (EXCEDRIN TENSION HEADACHE) 500-65 MG per tablet 2 tablet   caffeine tablet 200 mg   cyclobenzaprine (FLEXERIL) 10 MG tablet    Sig: Take 1 tablet (10 mg total) by mouth 2 (two) times daily as needed for muscle spasms.    Dispense:  20 tablet    Refill:  0    Order Specific Question:   Supervising Provider    Answer:   Rip Harbour, MICHAEL L [[id number]]   Assessment and Plan  --36 y.o. G2P1001 at [redacted]w[redacted]d  --Reactive tracing --Intact membranes --Headache in pregnancy: patient to trial Flexeril at home --Discharge home in stable condition  Darlina Rumpf, [place], MSN, CNM 01/25/2022, 9:14 PM

## 2022-01-25 NOTE — MAU Note (Signed)
.  Kimberly Poole is a 36 y.o. at [redacted]w[redacted]d here in MAU reporting: Pt reports ctx's since yesterday that worsened today  Onset of complaint: yesterday  Pain score: 8/10  There were no vitals filed for this visit.    Lab orders placed from triage:   none

## 2022-01-26 ENCOUNTER — Inpatient Hospital Stay (HOSPITAL_COMMUNITY)
Admission: AD | Admit: 2022-01-26 | Discharge: 2022-01-26 | Disposition: A | Discharge status: Home / Self Care | Payer: Self-pay | Attending: Obstetrics and Gynecology | Admitting: Obstetrics and Gynecology

## 2022-01-26 ENCOUNTER — Encounter (HOSPITAL_COMMUNITY): Payer: Self-pay | Admitting: Obstetrics and Gynecology

## 2022-01-26 ENCOUNTER — Inpatient Hospital Stay (HOSPITAL_COMMUNITY): Payer: Self-pay

## 2022-01-26 DIAGNOSIS — O09523 Supervision of elderly multigravida, third trimester: Secondary | ICD-10-CM | POA: Insufficient documentation

## 2022-01-26 DIAGNOSIS — O26893 Other specified pregnancy related conditions, third trimester: Secondary | ICD-10-CM | POA: Insufficient documentation

## 2022-01-26 DIAGNOSIS — F419 Anxiety disorder, unspecified: Secondary | ICD-10-CM

## 2022-01-26 DIAGNOSIS — O212 Late vomiting of pregnancy: Secondary | ICD-10-CM | POA: Insufficient documentation

## 2022-01-26 DIAGNOSIS — F32A Depression, unspecified: Secondary | ICD-10-CM

## 2022-01-26 DIAGNOSIS — O4693 Antepartum hemorrhage, unspecified, third trimester: Secondary | ICD-10-CM | POA: Insufficient documentation

## 2022-01-26 DIAGNOSIS — Z3A29 29 weeks gestation of pregnancy: Secondary | ICD-10-CM | POA: Insufficient documentation

## 2022-01-26 DIAGNOSIS — R45850 Homicidal ideations: Secondary | ICD-10-CM

## 2022-01-26 DIAGNOSIS — R109 Unspecified abdominal pain: Secondary | ICD-10-CM

## 2022-01-26 LAB — CBC
HCT: 31.9 % — ABNORMAL LOW (ref 36.0–46.0)
Hemoglobin: 10.8 g/dL — ABNORMAL LOW (ref 12.0–15.0)
MCH: 28.6 pg (ref 26.0–34.0)
MCHC: 33.9 g/dL (ref 30.0–36.0)
MCV: 84.6 fL (ref 80.0–100.0)
Platelets: 276 10*3/uL (ref 150–400)
RBC: 3.77 MIL/uL — ABNORMAL LOW (ref 3.87–5.11)
RDW: 12.5 % (ref 11.5–15.5)
WBC: 7.2 10*3/uL (ref 4.0–10.5)
nRBC: 0 % (ref 0.0–0.2)

## 2022-01-26 LAB — AMYLASE: Amylase: 71 U/L (ref 28–100)

## 2022-01-26 LAB — COMPREHENSIVE METABOLIC PANEL
ALT: 17 U/L (ref 0–44)
AST: 24 U/L (ref 15–41)
Albumin: 2.7 g/dL — ABNORMAL LOW (ref 3.5–5.0)
Alkaline Phosphatase: 124 U/L (ref 38–126)
Anion gap: 11 (ref 5–15)
BUN: 7 mg/dL (ref 6–20)
CO2: 17 mmol/L — ABNORMAL LOW (ref 22–32)
Calcium: 8.4 mg/dL — ABNORMAL LOW (ref 8.9–10.3)
Chloride: 106 mmol/L (ref 98–111)
Creatinine, Ser: 0.63 mg/dL (ref 0.44–1.00)
GFR, Estimated: >60 mL/min (ref >60)
Glucose, Bld: 89 mg/dL (ref 70–99)
Potassium: 3.6 mmol/L (ref 3.5–5.1)
Sodium: 134 mmol/L — ABNORMAL LOW (ref 135–145)
Total Bilirubin: 0.4 mg/dL (ref 0.3–1.2)
Total Protein: 6.6 g/dL (ref 6.5–8.1)

## 2022-01-26 LAB — LIPASE, BLOOD: Lipase: 34 U/L (ref 11–51)

## 2022-01-26 MED ORDER — HYDROMORPHONE HCL 1 MG/ML IJ SOLN
1.0000 mg | Freq: Once | INTRAMUSCULAR | Status: DC
  Filled 2022-01-26: qty 1

## 2022-01-26 MED ORDER — DICYCLOMINE HCL 10 MG/5ML PO SOLN
10.0000 mg | Freq: Once | ORAL | Status: AC
  Administered 2022-01-26: 10 mg via ORAL
  Filled 2022-01-26: qty 5

## 2022-01-26 MED ORDER — CYCLOBENZAPRINE HCL 5 MG PO TABS
10.0000 mg | ORAL_TABLET | Freq: Once | ORAL | Status: AC
  Administered 2022-01-26: 10 mg via ORAL
  Filled 2022-01-26: qty 2

## 2022-01-26 MED ORDER — PANTOPRAZOLE SODIUM 20 MG PO TBEC: 20.0000 mg | DELAYED_RELEASE_TABLET | Freq: Two times a day (BID) | ORAL | 5 refills | Status: DC

## 2022-01-26 MED ORDER — HYDROMORPHONE HCL 1 MG/ML IJ SOLN
0.5000 mg | Freq: Once | INTRAMUSCULAR | Status: AC
  Administered 2022-01-26: 0.5 mg via INTRAVENOUS

## 2022-01-26 MED ORDER — LIDOCAINE VISCOUS HCL 2 % MT SOLN
15.0000 mL | Freq: Once | OROMUCOSAL | Status: AC
  Administered 2022-01-26: 15 mL via OROMUCOSAL
  Filled 2022-01-26: qty 15

## 2022-01-26 MED ORDER — ONDANSETRON HCL 4 MG/2ML IJ SOLN
4.0000 mg | Freq: Once | INTRAMUSCULAR | Status: AC
  Administered 2022-01-26: 4 mg via INTRAVENOUS
  Filled 2022-01-26: qty 2

## 2022-01-26 MED ORDER — LACTATED RINGERS IV SOLN: Freq: Once | INTRAVENOUS | Status: AC

## 2022-01-26 MED ORDER — ALUM & MAG HYDROXIDE-SIMETH 200-200-20 MG/5ML PO SUSP
30.0000 mL | Freq: Once | ORAL | Status: AC
  Administered 2022-01-26: 30 mL via ORAL
  Filled 2022-01-26: qty 30

## 2022-01-26 MED ORDER — NIFEDIPINE 10 MG PO CAPS
10.0000 mg | ORAL_CAPSULE | ORAL | Status: DC | PRN
  Administered 2022-01-26: 10 mg via ORAL
  Filled 2022-01-26: qty 1

## 2022-01-26 MED ORDER — PANTOPRAZOLE SODIUM 40 MG IV SOLR
40.0000 mg | Freq: Once | INTRAVENOUS | Status: AC
  Administered 2022-01-26: 40 mg via INTRAVENOUS
  Filled 2022-01-26: qty 10

## 2022-01-26 NOTE — MAU Note (Signed)
Still waiting for SW to bring cab voucher (called x2)

## 2022-01-26 NOTE — MAU Note (Signed)
Pt. Is waiting for cab voucher (per Dr.)

## 2022-01-26 NOTE — MAU Note (Addendum)
Still c/o abd discomfort and headache. Some relief \\with  medication.  Requesting to talk to Dr. Crissie Reese.  Notified Dr. Crissie Reese and he will come and talk to patient in a few minutes

## 2022-01-26 NOTE — MAU Provider Note (Addendum)
Chief Complaint:  Abdominal Pain and Emesis   Event Date/Time   First Provider Initiated Contact with Patient 01/26/22 0544     HPI: Kimberly Poole is a 36 y.o. G2P1001 at 46w3dwho presents to maternity admissions reporting pain in right upper abdomen and vomiting.  Was seen here last night for what she though was contractions and leaking and both were ruled out   States was told she needed her gallbladder removed in [place].   Review of MRI in May 2023 and CT in February 2023 showed no evidence of gallstones.  . She reports good fetal movement, denies LOF, vaginal bleeding, vaginal itching/burning, urinary symptoms, h/a, dizziness, n/v, diarrhea, constipation or fever/chills.  She denies headache, visual changes or RUQ abdominal pain.  Abdominal Pain This is a new problem. The current episode started today. The pain is located in the RUQ. The pain is severe. The quality of the pain is cramping and colicky. Associated symptoms include nausea and vomiting. Pertinent negatives include no diarrhea, fever or myalgias. The pain is aggravated by palpation. The pain is relieved by Nothing. She has tried nothing (previously prescribed Pepcid) for the symptoms. The treatment provided no relief. Her past medical history is significant for GERD.  Emesis  This is a new problem. The current episode started today. There has been no fever. Associated symptoms include abdominal pain. Pertinent negatives include no diarrhea, fever or myalgias. She has tried nothing for the symptoms.   RN Note: Kent Braunschweig is a 36 y.o. at [redacted]w[redacted]d here in MAU reporting: Was here in MAU earlier for similar pain. Has had vomiting and has right upper abdominal pain that comes and goes.    Past Medical History: Past Medical History:  Diagnosis Date   Anxiety    Depression    Gallstones    Hypotension    Kidney stones    Migraines     Past obstetric history: OB History  Gravida Para Term  Preterm AB Living  2 1 1     1   SAB IAB Ectopic Multiple Live Births          1    # Outcome Date GA Lbr Len/2nd Weight Sex Delivery Anes PTL Lv  2 Current           1 Term 06/16/07   3000 g M CS-LTranv Spinal  LIV     Birth Comments: Had bleeding often in pregnancy, was on bedrest and in hospital often. Had tachycarida. C/S due to failure to dilate,labored x 3 days,  then found nuchal cord Born in 06/18/07    Past Surgical History: Past Surgical History:  Procedure Laterality Date   CESAREAN SECTION      Family History: Family History  Problem Relation Age of Onset   Lung disease Mother    Lung disease Father     Social History: Social History   Tobacco Use   Smoking status: Former    Types: Cigarettes    Quit date: 2020    Years since quitting: 3.8   Smokeless tobacco: Never  Vaping Use   Vaping Use: Never used  Substance Use Topics   Alcohol use: Not Currently    Comment: celebrations, stopped drinking alcohol 2022   Drug use: Not Currently    Types: Cocaine    Comment: last used 03/20/2017    Allergies:  Allergies  Allergen Reactions   Diclofenac Itching   Other Itching and Swelling    Nuts-causes itching tongue and  swelling of mouth    Meds:  Medications Prior to Admission  Medication Sig Dispense Refill Last Dose   Ascorbic Acid (VITAMIN C PO) Take by mouth.      B Complex Vitamins (VITAMIN B COMPLEX) TABS Take 1 tablet by mouth daily. (Patient not taking: Reported on 11/22/2021)      citalopram (CELEXA) 20 MG tablet TAKE 2 TABLETS (40 MG TOTAL) BY MOUTH AT BEDTIME. 60 tablet 3    cyclobenzaprine (FLEXERIL) 10 MG tablet Take 1 tablet (10 mg total) by mouth 2 (two) times daily as needed for muscle spasms. 20 tablet 0    dimenhyDRINATE (DRAMAMINE) 50 MG tablet Take 50 mg by mouth every 8 (eight) hours as needed for nausea. (Patient not taking: Reported on 11/22/2021)      hydrOXYzine (ATARAX) 25 MG tablet Take 1 tablet (25 mg total) by mouth daily with  breakfast. 30 tablet 0    pantoprazole (PROTONIX) 20 MG tablet Take 2 tablets (40 mg total) by mouth daily. 60 tablet 3    Prenatal Vit-Fe Fumarate-FA (PREPLUS) 27-1 MG TABS Take 1 tablet by mouth daily. 30 tablet 13    promethazine (PHENERGAN) 25 MG tablet Take 1 tablet (25 mg total) by mouth every 6 (six) hours as needed for nausea or vomiting. (Patient not taking: Reported on 12/13/2021) 30 tablet 1     I have reviewed patient's Past Medical Hx, Surgical Hx, Family Hx, Social Hx, medications and allergies.   ROS:  Review of Systems  Constitutional:  Negative for fever.  Gastrointestinal:  Positive for abdominal pain, nausea and vomiting. Negative for diarrhea.  Musculoskeletal:  Negative for myalgias.   Other systems negative  Physical Exam  Patient Vitals for the past 24 hrs:  BP Temp src Pulse Resp SpO2 Weight  01/26/22 0533 114/78 Oral 93 (!) 22 100 % 77.7 kg   Constitutional: Well-developed, well-nourished female retching and moaning in pain.  Cardiovascular: normal rate and rhythm Respiratory: normal effort GI: Abd soft, non-tender, gravid appropriate for gestational age.   Difficult to assess abdomen secondary to pt being doubled over. MS: Extremities nontender, no edema, normal ROM Neurologic: Alert and oriented x 4.  GU: Neg CVAT.  PELVIC EXAM: deferred   FHT:  Baseline 140 , moderate variability, accelerations present, no decelerations Contractions: q 5-7 mins Irregular    Labs: Results for orders placed or performed during the hospital encounter of 01/26/22 (from the past 24 hour(s))  CBC     Status: Abnormal   Collection Time: 01/26/22  5:56 AM  Result Value Ref Range   WBC 7.2 4.0 - 10.5 K/uL   RBC 3.77 (L) 3.87 - 5.11 MIL/uL   Hemoglobin 10.8 (L) 12.0 - 15.0 g/dL   HCT 42.8 (L) 76.8 - 11.5 %   MCV 84.6 80.0 - 100.0 fL   MCH 28.6 26.0 - 34.0 pg   MCHC 33.9 30.0 - 36.0 g/dL   RDW 72.6 20.3 - 55.9 %   Platelets 276 150 - 400 K/uL   nRBC 0.0 0.0 - 0.2 %   Comprehensive metabolic panel     Status: Abnormal   Collection Time: 01/26/22  5:56 AM  Result Value Ref Range   Sodium 134 (L) 135 - 145 mmol/L   Potassium 3.6 3.5 - 5.1 mmol/L   Chloride 106 98 - 111 mmol/L   CO2 17 (L) 22 - 32 mmol/L   Glucose, Bld 89 70 - 99 mg/dL   BUN 7 6 - 20 mg/dL  Creatinine, Ser 0.63 0.44 - 1.00 mg/dL   Calcium 8.4 (L) 8.9 - 10.3 mg/dL   Total Protein 6.6 6.5 - 8.1 g/dL   Albumin 2.7 (L) 3.5 - 5.0 g/dL   AST 24 15 - 41 U/L   ALT 17 0 - 44 U/L   Alkaline Phosphatase 124 38 - 126 U/L   Total Bilirubin 0.4 0.3 - 1.2 mg/dL   GFR, Estimated >84 >16 mL/min   Anion gap 11 5 - 15  Amylase     Status: None   Collection Time: 01/26/22  5:56 AM  Result Value Ref Range   Amylase 71 28 - 100 U/L  Lipase, blood     Status: None   Collection Time: 01/26/22  5:56 AM  Result Value Ref Range   Lipase 34 11 - 51 U/L    O/Positive/-- (09/05 1150)  Imaging:    MAU Course/MDM: I have reviewed the triage vital signs and the nursing notes.   Pertinent labs & imaging results that were available during my care of the patient were reviewed by me and considered in my medical decision making (see chart for details).      I have reviewed her medical records including past results, notes and treatments.   I have ordered labs and reviewed results. Transaminases normal   No leukocytosis.  NST reviewed, reassuring with intertmittent contractions.  Consult Dr Crissie Reese with presentation, exam findings and test results.  Treatments in MAU included IV fluids, Procardia series, Dilaudid 0.5mg  x 1.  Ordered Mylanta and Bentyl  Assessment: Single IUP at [redacted]w[redacted]d RUQ pain, biliary colic?  No evidence of cholelithiasis Preterm uterine contractions.   Plan: Procardia series.\ Report given to Dr Crissie Reese  Wynelle Bourgeois CNM, MSN Certified Nurse-Midwife 01/26/2022 5:44 AM    Addendum Assumed care of patient at change of shift. On my interview patient states pain has been  coming since last night, on and off, and is very strong. She had similar pain in her last pregnancy 14 years ago along with vaginal bleeding for which she was admitted for two weeks. Pain is more anterior in her abdomen, and shoots from the RUQ to her epigastrium. Does not have flank pain. No burning or pain with urination. No vaginal bleeding or leaking fluid. Does not smoke, no recent travel, no LE edema.   On exam patient had NO tenderness to palpation throughout the abdomen.   Review of labs shows unremarkable CBC, CMP, lipase, amylase. RUQ [place] images reviewed personally, shows no evidence of gallstones, normal CBD diameter. Ddx at this point remains broad, though biliary etiology has been ruled out. Gastritis still a strong possibility given she is from [place], has already received fluids, mylanta, bentyl, will also give PPI and viscous lidocaine. Reports hx of renal stones with last pregnancy, MRI from May 2023 was normal without evidence of hydronephrosis or stones but will obtain renal US to complete workup. Unlikely to be placental/uterine, tracing is reactive, Cat I, very reassuring. Does have some contractions but these have spaced with procardia and she was closed on exam last night. Atypical presentation of PE is a consideration as well, but patient is not tachycardic, not hypoxic, and does not have additional risk factors beyond pregnancy, defer CTPA at this time.  I discussed this with the patient and reviewed workup to date as well as what we would do additionally right now. During this conversation she asked if stopping her mental health meds could have contributed to what's going on  now, as she went without them for several days and only restarted. She also admitted that she has been having much more anxiety and panic attacks of late. We discussed this is a strong possibility given normal workup to date, but we would rule out life threatening causes first before attributing her pain  to psychiatric etiology.   9:18 AM  Patient reported some relief with viscous lidocaine but ongoing pain.   She asked me to return to the room and we had a long conversation. She told me that she felt her symptoms were due to anxiety and depression. She reported that she is having a lot of problems with her partner who she feels is not supportive. He has four children from a prior relationship and she struggles to care for her daughter and the five children altogether. They live in a small, cramped trailer. She told me she used to cut herself, but has not done so for some time and not at all during this pregnancy. She denied  having thoughts of suicide. She then reported many issues with her partner's ex, saying she comes around and bothers her, she's had to put a restraining order. She then asked if what we discussed was private, and disclosed to me that she has recurrent thoughts and dreams about stabbing her partner's ex to death in a bath tub.   At this point I contacted Dr. Lucianne Muss from psych and relayed the history. I do not feel she has any life threatening causes of her pain. She may possibly have gastritis and I will prescribe a PPI at discharge. She recommended patient be discharged to go to [facility] immediately for psychiatric evaluation and connection with resources. I messaged the FNP covering [facility] right now and they are aware she will go directly there from here. I discussed this plan with the patient, after some convincing she is amenable with this plan. Given clinical situation I asked house coverage to provide a cab voucher which they will do.   Dispo: discharged to [facility]  12:33 PM  Venora Maples, MD/MPH Attending Family Medicine Physician, [facility] for [facility], [facility]

## 2022-01-26 NOTE — MAU Note (Signed)
..  Lory Nowaczyk [name] is a 36 y.o. at [redacted]w[redacted]d here in MAU reporting: Was here in MAU earlier for similar pain. Has had vomiting and has right upper abdominal pain that comes and goes.   Pain score: 10/10 Vitals:   01/26/22 0533  BP: 114/78  Pulse: 93  Resp: (!) 22  SpO2: 100%     FHT:130 Lab orders placed from triage:  UA

## 2022-02-08 ENCOUNTER — Ambulatory Visit: Payer: Self-pay

## 2022-02-11 ENCOUNTER — Encounter (HOSPITAL_COMMUNITY): Payer: Self-pay | Admitting: Obstetrics and Gynecology

## 2022-02-11 ENCOUNTER — Inpatient Hospital Stay (HOSPITAL_COMMUNITY)
Admission: AD | Admit: 2022-02-11 | Discharge: 2022-02-13 | DRG: 833 | Disposition: A | Discharge status: Home / Self Care | Payer: Medicaid Other | Attending: Obstetrics and Gynecology | Admitting: Obstetrics and Gynecology

## 2022-02-11 DIAGNOSIS — Z87442 Personal history of urinary calculi: Secondary | ICD-10-CM

## 2022-02-11 DIAGNOSIS — Z3A32 32 weeks gestation of pregnancy: Secondary | ICD-10-CM

## 2022-02-11 DIAGNOSIS — Z87891 Personal history of nicotine dependence: Secondary | ICD-10-CM

## 2022-02-11 DIAGNOSIS — O09523 Supervision of elderly multigravida, third trimester: Secondary | ICD-10-CM

## 2022-02-11 DIAGNOSIS — O4700 False labor before 37 completed weeks of gestation, unspecified trimester: Principal | ICD-10-CM | POA: Diagnosis present

## 2022-02-11 LAB — URINALYSIS, ROUTINE W REFLEX MICROSCOPIC
Bilirubin Urine: NEGATIVE
Glucose, UA: NEGATIVE mg/dL
Hgb urine dipstick: NEGATIVE
Ketones, ur: 20 mg/dL — AB
Leukocytes,Ua: NEGATIVE
Nitrite: NEGATIVE
Protein, ur: NEGATIVE mg/dL
Specific Gravity, Urine: 1.012 (ref 1.005–1.030)
pH: 6 (ref 5.0–8.0)

## 2022-02-11 LAB — CBC
HCT: 32.1 % — ABNORMAL LOW (ref 36.0–46.0)
Hemoglobin: 10.7 g/dL — ABNORMAL LOW (ref 12.0–15.0)
MCH: 27.6 pg (ref 26.0–34.0)
MCHC: 33.3 g/dL (ref 30.0–36.0)
MCV: 82.7 fL (ref 80.0–100.0)
Platelets: 330 10*3/uL (ref 150–400)
RBC: 3.88 MIL/uL (ref 3.87–5.11)
RDW: 12.7 % (ref 11.5–15.5)
WBC: 9.5 10*3/uL (ref 4.0–10.5)
nRBC: 0.2 % (ref 0.0–0.2)

## 2022-02-11 LAB — COMPREHENSIVE METABOLIC PANEL
ALT: 29 U/L (ref 0–44)
AST: 29 U/L (ref 15–41)
Albumin: 2.8 g/dL — ABNORMAL LOW (ref 3.5–5.0)
Alkaline Phosphatase: 139 U/L — ABNORMAL HIGH (ref 38–126)
Anion gap: 16 — ABNORMAL HIGH (ref 5–15)
BUN: 9 mg/dL (ref 6–20)
CO2: 16 mmol/L — ABNORMAL LOW (ref 22–32)
Calcium: 9.1 mg/dL (ref 8.9–10.3)
Chloride: 103 mmol/L (ref 98–111)
Creatinine, Ser: 0.78 mg/dL (ref 0.44–1.00)
GFR, Estimated: >60 mL/min (ref >60)
Glucose, Bld: 97 mg/dL (ref 70–99)
Potassium: 3.5 mmol/L (ref 3.5–5.1)
Sodium: 135 mmol/L (ref 135–145)
Total Bilirubin: 0.4 mg/dL (ref 0.3–1.2)
Total Protein: 6.6 g/dL (ref 6.5–8.1)

## 2022-02-11 LAB — RAPID URINE DRUG SCREEN, HOSP PERFORMED
Amphetamines: NOT DETECTED
Barbiturates: NOT DETECTED
Benzodiazepines: NOT DETECTED
Cocaine: NOT DETECTED
Opiates: NOT DETECTED
Tetrahydrocannabinol: NOT DETECTED

## 2022-02-11 LAB — AMYLASE: Amylase: 79 U/L (ref 28–100)

## 2022-02-11 LAB — LIPASE, BLOOD: Lipase: 40 U/L (ref 11–51)

## 2022-02-11 MED ORDER — HYDROXYZINE HCL 50 MG/ML IM SOLN
25.0000 mg | Freq: Four times a day (QID) | INTRAMUSCULAR | Status: DC | PRN
  Administered 2022-02-11: 25 mg via INTRAMUSCULAR
  Filled 2022-02-11 (×2): qty 0.5

## 2022-02-11 MED ORDER — NIFEDIPINE 10 MG PO CAPS
10.0000 mg | ORAL_CAPSULE | ORAL | Status: AC | PRN
  Administered 2022-02-12 (×3): 10 mg via ORAL
  Filled 2022-02-11 (×3): qty 1

## 2022-02-11 NOTE — MAU Provider Note (Incomplete)
  History     CSN: [id number]  Arrival date and time: 02/11/22 2003   Event Date/Time   First Provider Initiated Contact with Patient 02/11/22 2040      Chief Complaint  Patient presents with   Contractions   HPI  {GYN/OB [id number]}  Past Medical History:  Diagnosis Date   Anxiety    Depression    Gallstones    Hypotension    Kidney stones    Migraines     Past Surgical History:  Procedure Laterality Date   CESAREAN SECTION      Family History  Problem Relation Age of Onset   Lung disease Mother    Lung disease Father     Social History   Tobacco Use   Smoking status: Former    Types: Cigarettes    Quit date: 2020    Years since quitting: 3.9   Smokeless tobacco: Never  Vaping Use   Vaping Use: Never used  Substance Use Topics   Alcohol use: Not Currently    Comment: celebrations, stopped drinking alcohol 2022   Drug use: Not Currently    Types: Cocaine    Comment: last used 03/20/2017    Allergies:  Allergies  Allergen Reactions   Diclofenac Itching   Other Itching and Swelling    Nuts-causes itching tongue and swelling of mouth    Medications Prior to Admission  Medication Sig Dispense Refill Last Dose   citalopram (CELEXA) 20 MG tablet TAKE 2 TABLETS (40 MG TOTAL) BY MOUTH AT BEDTIME. 60 tablet 3 02/11/2022   Ascorbic Acid (VITAMIN C PO) Take by mouth.      cyclobenzaprine (FLEXERIL) 10 MG tablet Take 1 tablet (10 mg total) by mouth 2 (two) times daily as needed for muscle spasms. 20 tablet 0    hydrOXYzine (ATARAX) 25 MG tablet Take 1 tablet (25 mg total) by mouth daily with breakfast. 30 tablet 0    pantoprazole (PROTONIX) 20 MG tablet Take 2 tablets (40 mg total) by mouth daily. 60 tablet 3    pantoprazole (PROTONIX) 20 MG tablet Take 1 tablet (20 mg total) by mouth 2 (two) times daily before a meal. 60 tablet 5    Prenatal Vit-Fe Fumarate-FA (PREPLUS) 27-1 MG TABS Take 1 tablet by mouth daily. 30 tablet 13    promethazine (PHENERGAN)  25 MG tablet Take 1 tablet (25 mg total) by mouth every 6 (six) hours as needed for nausea or vomiting. 30 tablet 1     Review of Systems Physical Exam   Blood pressure 110/72, pulse 95, temperature 97.9 F (36.6 C), temperature source Oral, resp. rate 18, last menstrual period 06/18/2021, SpO2 100 %.  Physical Exam  MAU Course  Procedures  MDM ***  Assessment and Plan  ***  Kimberly Poole 02/11/2022, 8:40 PM

## 2022-02-11 NOTE — MAU Note (Addendum)
.  Kimberly Poole is a 36 y.o. at [redacted]w[redacted]d here in MAU reporting: pt stat she can not walk due to pain-taken via wheelchair to room 24. Ambulatory to bathroom-urine sample collected. Reports uterine cramping/possible contractions that began an hour ago. Pt denies vaginal bleeding. Pt unsure if vaginal wetness is urine or SROM. Pt states pain is located across upper abdomen and lower abdomen  Hospital interpreter [facility] at bedside Onset of complaint: 1930 Pain score: 10 Vitals:   02/11/22 2019  BP: 110/72  Pulse: 95  Resp: 18  Temp: 97.9 F (36.6 C)  SpO2: 100%     FHT:145bpm Lab orders placed from triage:  UA

## 2022-02-12 ENCOUNTER — Other Ambulatory Visit: Payer: Self-pay

## 2022-02-12 DIAGNOSIS — Z87442 Personal history of urinary calculi: Secondary | ICD-10-CM | POA: Diagnosis not present

## 2022-02-12 DIAGNOSIS — O09523 Supervision of elderly multigravida, third trimester: Secondary | ICD-10-CM | POA: Diagnosis not present

## 2022-02-12 DIAGNOSIS — R109 Unspecified abdominal pain: Secondary | ICD-10-CM | POA: Diagnosis present

## 2022-02-12 DIAGNOSIS — Z3A32 32 weeks gestation of pregnancy: Secondary | ICD-10-CM | POA: Diagnosis not present

## 2022-02-12 DIAGNOSIS — Z87891 Personal history of nicotine dependence: Secondary | ICD-10-CM | POA: Diagnosis not present

## 2022-02-12 DIAGNOSIS — O4700 False labor before 37 completed weeks of gestation, unspecified trimester: Principal | ICD-10-CM | POA: Diagnosis present

## 2022-02-12 LAB — TYPE AND SCREEN
ABO/RH(D): O POS
Antibody Screen: NEGATIVE

## 2022-02-12 LAB — MAGNESIUM
Magnesium: 5.7 mg/dL — ABNORMAL HIGH (ref 1.7–2.4)
Magnesium: 6.1 mg/dL (ref 1.7–2.4)

## 2022-02-12 LAB — POCT FERN TEST: POCT Fern Test: NEGATIVE

## 2022-02-12 LAB — WET PREP, GENITAL
Clue Cells Wet Prep HPF POC: NONE SEEN
Sperm: NONE SEEN
Trich, Wet Prep: NONE SEEN
WBC, Wet Prep HPF POC: <10 (ref <10)
Yeast Wet Prep HPF POC: NONE SEEN

## 2022-02-12 LAB — RPR: RPR Ser Ql: NONREACTIVE

## 2022-02-12 MED ORDER — HYDROXYZINE HCL 50 MG PO TABS
50.0000 mg | ORAL_TABLET | Freq: Every day | ORAL | Status: DC
  Administered 2022-02-12: 50 mg via ORAL
  Filled 2022-02-12: qty 1

## 2022-02-12 MED ORDER — LACTATED RINGERS IV SOLN: 125.0000 mL/h | INTRAVENOUS | Status: AC

## 2022-02-12 MED ORDER — OXYCODONE-ACETAMINOPHEN 5-325 MG PO TABS
1.0000 | ORAL_TABLET | ORAL | Status: DC | PRN
  Administered 2022-02-13 (×2): 2 via ORAL
  Filled 2022-02-12 (×2): qty 2

## 2022-02-12 MED ORDER — TERBUTALINE SULFATE 1 MG/ML IJ SOLN: 0.2500 mg | Freq: Once | INTRAMUSCULAR | Status: DC | PRN

## 2022-02-12 MED ORDER — BUTALBITAL-APAP-CAFFEINE 50-325-40 MG PO TABS
2.0000 | ORAL_TABLET | Freq: Once | ORAL | Status: AC
  Administered 2022-02-12: 2 via ORAL
  Filled 2022-02-12: qty 2

## 2022-02-12 MED ORDER — DOCUSATE SODIUM 100 MG PO CAPS
100.0000 mg | ORAL_CAPSULE | Freq: Every day | ORAL | Status: DC
  Filled 2022-02-12 (×2): qty 1

## 2022-02-12 MED ORDER — DIPHENHYDRAMINE HCL 25 MG PO CAPS
25.0000 mg | ORAL_CAPSULE | Freq: Four times a day (QID) | ORAL | Status: DC | PRN
  Administered 2022-02-12: 25 mg via ORAL
  Filled 2022-02-12: qty 1

## 2022-02-12 MED ORDER — PRENATAL MULTIVITAMIN CH
1.0000 | ORAL_TABLET | Freq: Every day | ORAL | Status: DC
  Administered 2022-02-12 – 2022-02-13 (×2): 1 via ORAL
  Filled 2022-02-12 (×2): qty 1

## 2022-02-12 MED ORDER — PANTOPRAZOLE SODIUM 40 MG PO TBEC
40.0000 mg | DELAYED_RELEASE_TABLET | Freq: Every day | ORAL | Status: DC
  Administered 2022-02-12 – 2022-02-13 (×2): 40 mg via ORAL
  Filled 2022-02-12 (×2): qty 1

## 2022-02-12 MED ORDER — ZOLPIDEM TARTRATE 5 MG PO TABS
5.0000 mg | ORAL_TABLET | Freq: Every evening | ORAL | Status: DC | PRN
  Administered 2022-02-12: 5 mg via ORAL
  Filled 2022-02-12: qty 1

## 2022-02-12 MED ORDER — HYDROMORPHONE HCL 1 MG/ML IJ SOLN
0.5000 mg | Freq: Once | INTRAMUSCULAR | Status: AC
  Administered 2022-02-12: 0.5 mg via INTRAVENOUS
  Filled 2022-02-12: qty 1

## 2022-02-12 MED ORDER — OXYCODONE-ACETAMINOPHEN 5-325 MG PO TABS
1.0000 | ORAL_TABLET | Freq: Once | ORAL | Status: AC
  Administered 2022-02-12: 1 via ORAL
  Filled 2022-02-12: qty 1

## 2022-02-12 MED ORDER — LACTATED RINGERS IV SOLN: INTRAVENOUS | Status: DC

## 2022-02-12 MED ORDER — BETAMETHASONE SOD PHOS & ACET 6 (3-3) MG/ML IJ SUSP
12.0000 mg | INTRAMUSCULAR | Status: AC
  Administered 2022-02-12 – 2022-02-13 (×2): 12 mg via INTRAMUSCULAR
  Filled 2022-02-12: qty 5

## 2022-02-12 MED ORDER — CITALOPRAM HYDROBROMIDE 40 MG PO TABS
40.0000 mg | ORAL_TABLET | Freq: Every day | ORAL | Status: DC
  Filled 2022-02-12: qty 1

## 2022-02-12 MED ORDER — MAGNESIUM SULFATE 40 GM/1000ML IV SOLN
2.0000 g/h | INTRAVENOUS | Status: DC
  Administered 2022-02-12: 2 g/h via INTRAVENOUS
  Filled 2022-02-12: qty 1000

## 2022-02-12 MED ORDER — CALCIUM GLUCONATE-NACL 1-0.675 GM/50ML-% IV SOLN
1.0000 g | Freq: Once | INTRAVENOUS | Status: AC
  Administered 2022-02-12: 1000 mg via INTRAVENOUS
  Filled 2022-02-12: qty 50

## 2022-02-12 MED ORDER — ACETAMINOPHEN-CAFFEINE 500-65 MG PO TABS
2.0000 | ORAL_TABLET | Freq: Four times a day (QID) | ORAL | Status: DC | PRN
  Administered 2022-02-12: 2 via ORAL
  Filled 2022-02-12 (×2): qty 2

## 2022-02-12 MED ORDER — CITALOPRAM HYDROBROMIDE 40 MG PO TABS
40.0000 mg | ORAL_TABLET | Freq: Every day | ORAL | Status: DC
  Administered 2022-02-13: 40 mg via ORAL
  Filled 2022-02-12: qty 1

## 2022-02-12 MED ORDER — CALCIUM CARBONATE ANTACID 500 MG PO CHEW: 2.0000 | CHEWABLE_TABLET | ORAL | Status: DC | PRN

## 2022-02-12 MED ORDER — OXYCODONE HCL 5 MG PO TABS
5.0000 mg | ORAL_TABLET | Freq: Once | ORAL | Status: AC
  Administered 2022-02-12: 5 mg via ORAL
  Filled 2022-02-12: qty 1

## 2022-02-12 MED ORDER — ACETAMINOPHEN 325 MG PO TABS
650.0000 mg | ORAL_TABLET | ORAL | Status: DC | PRN
  Administered 2022-02-12: 650 mg via ORAL
  Filled 2022-02-12: qty 2

## 2022-02-12 MED ORDER — MAGNESIUM SULFATE BOLUS VIA INFUSION
4.0000 g | Freq: Once | INTRAVENOUS | Status: AC
  Administered 2022-02-12: 4 g via INTRAVENOUS
  Filled 2022-02-12: qty 1000

## 2022-02-12 MED ORDER — HYDROMORPHONE HCL 1 MG/ML IJ SOLN: 1.0000 mg | Freq: Once | INTRAMUSCULAR | Status: DC

## 2022-02-12 NOTE — H&P (Signed)
History     CSN: [id number]  Arrival date and time: 02/11/22 2003   Event Date/Time   First Provider Initiated Contact with Patient 02/11/22 2040      Chief Complaint  Patient presents with   Contractions   Upon arrival to room patient visibly upset. Patient oriented x3, but story of onset of pain and what she was doing prior was vague and incomplete. She was difficult to understand and the story was very difficult to follow. Concerns voiced by in-person Spanish interpreter, that she was not making sense.  Patient was coughing, dry heaving and  Uncontrollably sobbing with complaints of not being able to breathe. And audible wheezing. Lungs clear. Patient sating at 100% on Room air. Appears to be having an panic attack. O2 given 2L by nasal canula and 1 dose of IM Vistaril patient was able to calm down and voice chief complaints.   Kimberly Poole , a  36 y.o. G2P1001 at [redacted]w[redacted]d presents to MAU with complaints of Intermittent Right sided abdominal and midline pelvic pain that started about 1 hour ago. She began to feel light headed and dizzy and then the pain started. She denies attempting to relieve pain and also reports some gushes of fluid when she was coughing and dry heaving in MAU. Pain 9/10. Denies headache blurred vision, epigastric pain and endorses positive fetal movement. Patient 15yo daughter brought to bedside to contribute to HPI and help patient calm down. Daughter endorsed long history of anxiety and panic attacks and daily use of Celexa.          OB History     Gravida  2   Para  1   Term  1   Preterm      AB      Living  1      SAB      IAB      Ectopic      Multiple      Live Births  1           Past Medical History:  Diagnosis Date   Anxiety    Depression    Gallstones    Hypotension    Kidney stones    Migraines     Past Surgical History:  Procedure Laterality Date   CESAREAN SECTION      Family History   Problem Relation Age of Onset   Lung disease Mother    Lung disease Father     Social History   Tobacco Use   Smoking status: Former    Types: Cigarettes    Quit date: 2020    Years since quitting: 3.9   Smokeless tobacco: Never  Vaping Use   Vaping Use: Never used  Substance Use Topics   Alcohol use: Not Currently    Comment: celebrations, stopped drinking alcohol 2022   Drug use: Not Currently    Types: Cocaine    Comment: last used 03/20/2017    Allergies:  Allergies  Allergen Reactions   Diclofenac Itching   Other Itching and Swelling    Nuts-causes itching tongue and swelling of mouth    Medications Prior to Admission  Medication Sig Dispense Refill Last Dose   citalopram (CELEXA) 20 MG tablet TAKE 2 TABLETS (40 MG TOTAL) BY MOUTH AT BEDTIME. 60 tablet 3 02/11/2022   Ascorbic Acid (VITAMIN C PO) Take by mouth.      cyclobenzaprine (FLEXERIL) 10 MG tablet Take 1 tablet (10 mg total) by mouth 2 (  two) times daily as needed for muscle spasms. 20 tablet 0    hydrOXYzine (ATARAX) 25 MG tablet Take 1 tablet (25 mg total) by mouth daily with breakfast. 30 tablet 0    pantoprazole (PROTONIX) 20 MG tablet Take 2 tablets (40 mg total) by mouth daily. 60 tablet 3    pantoprazole (PROTONIX) 20 MG tablet Take 1 tablet (20 mg total) by mouth 2 (two) times daily before a meal. 60 tablet 5    Prenatal Vit-Fe Fumarate-FA (PREPLUS) 27-1 MG TABS Take 1 tablet by mouth daily. 30 tablet 13    promethazine (PHENERGAN) 25 MG tablet Take 1 tablet (25 mg total) by mouth every 6 (six) hours as needed for nausea or vomiting. 30 tablet 1     Review of Systems  Constitutional:  Negative for chills, fatigue and fever.  Eyes:  Negative for pain and visual disturbance.  Respiratory:  Positive for shortness of breath. Negative for apnea and wheezing.   Cardiovascular:  Negative for chest pain and palpitations.  Gastrointestinal:  Positive for abdominal pain. Negative for constipation, diarrhea,  nausea and vomiting.  Genitourinary:  Positive for pelvic pain. Negative for difficulty urinating, dysuria, vaginal bleeding, vaginal discharge and vaginal pain.  Musculoskeletal:  Negative for back pain.  Neurological:  Positive for dizziness and light-headedness. Negative for seizures, weakness and headaches.  Psychiatric/Behavioral:  Negative for suicidal ideas. The patient is nervous/anxious.    Physical Exam   Blood pressure 110/72, pulse 95, temperature 97.9 F (36.6 C), temperature source Oral, resp. rate 18, last menstrual period 06/18/2021, SpO2 100 %.  Physical Exam Vitals and nursing note reviewed. Exam conducted with a chaperone present.  Constitutional:      General: She is in acute distress.     Appearance: Normal appearance.  HENT:     Head: Normocephalic.  Cardiovascular:     Rate and Rhythm: Tachycardia present.     Heart sounds: Normal heart sounds.  Pulmonary:     Effort: Pulmonary effort is normal.     Breath sounds: Normal breath sounds.  Abdominal:     Tenderness: There is abdominal tenderness. There is guarding. There is no right CVA tenderness or left CVA tenderness.     Comments: Contractions palpated moderate   Musculoskeletal:        General: Normal range of motion.     Cervical back: Normal range of motion.  Skin:    General: Skin is warm and dry.  Neurological:     Mental Status: She is alert and oriented to person, place, and time.  Psychiatric:        Mood and Affect: Mood is anxious. Affect is tearful.        Speech: Speech is rapid and pressured.        Behavior: Behavior is cooperative.    FHT: 125 with moderate variability and 15x15 accels. No decels noted  Toco: 4-5 mins moderate to palpation.    MAU Course  Procedures Orders Placed This Encounter  Procedures   Wet prep, genital   Urinalysis, Routine w reflex microscopic   Lipase, blood   Amylase   CBC   Comprehensive metabolic panel   Rapid urine drug screen (hospital  performed)   RPR   Diet NPO time specified   Notify physician (specify)   Vital signs   Defer vaginal exam for vaginal bleeding or PROM <37 weeks   Apply Antepartum Care Plan   Initiate Oral Care Protocol   Initiate Carrier Fluid Protocol  Place TED hose   SCDs   Fetal monitoring   Bed rest with bathroom privileges   Strict intake and output   Full code   Fern Test   [facility] and screen [facility]   Insert peripheral IV   Admit to Inpatient (patient's expected length of stay will be greater than 2 midnights or inpatient only procedure)   Meds ordered this encounter  Medications   hydrOXYzine (VISTARIL) injection 25 mg   NIFEdipine (PROCARDIA) capsule 10 mg   DISCONTD: HYDROmorphone (DILAUDID) injection 1 mg   HYDROmorphone (DILAUDID) injection 0.5 mg   lactated ringers infusion   acetaminophen (TYLENOL) tablet 650 mg   docusate sodium (COLACE) capsule 100 mg   calcium carbonate (TUMS - dosed in mg elemental calcium) chewable tablet 400 mg of elemental calcium   prenatal multivitamin tablet 1 tablet   betamethasone acetate-betamethasone sodium phosphate (CELESTONE) injection 12 mg   terbutaline (BRETHINE) injection 0.25 mg   magnesium bolus via infusion 4 g   magnesium sulfate 40 grams in SWI 1000 mL OB infusion   lactated ringers infusion   Results for orders placed or performed during the hospital encounter of 02/11/22 (from the past 24 hour(s))  Urinalysis, Routine w reflex microscopic     Status: Abnormal   Collection Time: 02/11/22  8:49 PM  Result Value Ref Range   Color, Urine YELLOW YELLOW   APPearance HAZY (A) CLEAR   Specific Gravity, Urine 1.012 1.005 - 1.030   pH 6.0 5.0 - 8.0   Glucose, UA NEGATIVE NEGATIVE mg/dL   Hgb urine dipstick NEGATIVE NEGATIVE   Bilirubin Urine NEGATIVE NEGATIVE   Ketones, ur 20 (A) NEGATIVE mg/dL   Protein, ur NEGATIVE NEGATIVE mg/dL   Nitrite NEGATIVE NEGATIVE   Leukocytes,Ua NEGATIVE NEGATIVE  Rapid urine drug  screen (hospital performed)     Status: None   Collection Time: 02/11/22  8:49 PM  Result Value Ref Range   Opiates NONE DETECTED NONE DETECTED   Cocaine NONE DETECTED NONE DETECTED   Benzodiazepines NONE DETECTED NONE DETECTED   Amphetamines NONE DETECTED NONE DETECTED   Tetrahydrocannabinol NONE DETECTED NONE DETECTED   Barbiturates NONE DETECTED NONE DETECTED  Lipase, blood     Status: None   Collection Time: 02/11/22  9:13 PM  Result Value Ref Range   Lipase 40 11 - 51 U/L  Amylase     Status: None   Collection Time: 02/11/22  9:13 PM  Result Value Ref Range   Amylase 79 28 - 100 U/L  CBC     Status: Abnormal   Collection Time: 02/11/22  9:13 PM  Result Value Ref Range   WBC 9.5 4.0 - 10.5 K/uL   RBC 3.88 3.87 - 5.11 MIL/uL   Hemoglobin 10.7 (L) 12.0 - 15.0 g/dL   HCT 57.832.1 (L) 46.936.0 - 62.946.0 %   MCV 82.7 80.0 - 100.0 fL   MCH 27.6 26.0 - 34.0 pg   MCHC 33.3 30.0 - 36.0 g/dL   RDW 52.812.7 41.311.5 - 24.415.5 %   Platelets 330 150 - 400 K/uL   nRBC 0.2 0.0 - 0.2 %  Comprehensive metabolic panel     Status: Abnormal   Collection Time: 02/11/22  9:13 PM  Result Value Ref Range   Sodium 135 135 - 145 mmol/L   Potassium 3.5 3.5 - 5.1 mmol/L   Chloride 103 98 - 111 mmol/L   CO2 16 (L) 22 - 32 mmol/L   Glucose, Bld 97 70 -  99 mg/dL   BUN 9 6 - 20 mg/dL   Creatinine, Ser 0.17 0.44 - 1.00 mg/dL   Calcium 9.1 8.9 - 79.3 mg/dL   Total Protein 6.6 6.5 - 8.1 g/dL   Albumin 2.8 (L) 3.5 - 5.0 g/dL   AST 29 15 - 41 U/L   ALT 29 0 - 44 U/L   Alkaline Phosphatase 139 (H) 38 - 126 U/L   Total Bilirubin 0.4 0.3 - 1.2 mg/dL   GFR, Estimated >90 >30 mL/min   Anion gap 16 (H) 5 - 15  Type and screen [facility]     Status: None (Preliminary result)   Collection Time: 02/11/22  9:13 PM  Result Value Ref Range   ABO/RH(D) PENDING    Antibody Screen PENDING    Sample Expiration      02/14/2022,2359 Performed at [facility], [address], [place], [place] [address]   Wet  prep, genital     Status: None   Collection Time: 02/11/22 11:49 PM  Result Value Ref Range   Yeast Wet Prep HPF POC NONE SEEN NONE SEEN   Trich, Wet Prep NONE SEEN NONE SEEN   Clue Cells Wet Prep HPF POC NONE SEEN NONE SEEN   WBC, Wet Prep HPF POC <10 <10   Sperm NONE SEEN   Fern Test     Status: None   Collection Time: 02/12/22 12:10 AM  Result Value Ref Range   POCT Fern Test Negative = intact amniotic membranes    Patient Vitals for the past 24 hrs:  BP Temp Temp src Pulse Resp SpO2  02/12/22 0108 107/72 -- -- -- -- --  02/12/22 0029 97/69 -- -- 71 -- --  02/12/22 0006 100/70 -- -- 65 -- --  02/11/22 2358 100/70 -- -- -- -- --  02/11/22 2208 -- -- -- -- -- 100 %  02/11/22 2121 -- -- -- -- -- 100 %  02/11/22 2047 -- -- -- -- -- 99 %  02/11/22 2019 110/72 97.9 F (36.6 C) Oral 95 18 100 %     MDM - Patient hx of drug use, and initial clinical presentation UDS collected.  - Patient calmed down after IM Vistaril and 2L Nasal canula. Patient endorses histroy of anxiety and depression. Daily use of Celexa. Last dose this AM per patient.  - UDS Normal  - WBC normal, no rebound tenderness, not tenderness to palpation. low suspicion for appendicitis  - Liver enzymes normal  - Attempted Sterile Spec exam, but patient did not tolerate well.  - Attempted SVE, patient also not tolerable to exam. Dilation: Fingertip Effacement (%): Thick Exam by:: Dorathy Daft, CNM - Fern Negative  - Wet prep Normal  - IV fluids and procardia series initiated.  - IV Dilaudid ordered.  - Despite 1 LR bolus and initial dose of procardia patient still contracting q4-5 mins.  Moderate to palpation. 2nd LR bag ordered.   - At time of 2nd dose of procardia patient BP <100 systolic and too low for repeat dose.  - Consulted Dr. Jolayne Panther, on patient presentation and Low BPs on continuing procardia series. Discussed current clinical picture and lab results.  Per MD admit to [facility] for PTL.  - RN notified for  Admit  - Bedside US to confirm Vertex presentation    Assessment and Plan  - Admit to [facility] for PTL with Mag and BMZ.   Claudette Head, MSN CNM  02/11/2022, 8:40 PM

## 2022-02-12 NOTE — Consult Note (Signed)
Consultation Service: Neonatology   Dr. Elgie Poole has asked for consultation on Kimberly Poole regarding the care of a premature infant at [redacted]w[redacted]d Thank you for inviting [place] see this patient.   Reason for consult:  Explain the possible complications, the prognosis, and the care of a premature infant at 310and 6/7 weeks.  Chief complaint: 36y.o. female with a singleton IUP with an estimated weight of 1127  grams (58% last on 10/25). Pregnancy has been complicated by  preterm labor .  Plan is for delivery via vaginal delivery if possible.  My key findings of this patient's HPI are:  I have reviewed the patient's chart and have met with her. The salient information is as follows:   [facility] and is dilated to 0.5 cm with improved timing between contractions. Monitoring for and fetal/maternal distress with  no concerns at this time. On betamethasone and continuous monitoring.    Prenatal labs:   Prenatal care:   good Pregnancy complications:  preterm labor Maternal antibiotics: This patient's mother is not on file. Maternal Steroids: Yes Most recent dose:  0220 on 11/26   My recommendations for this patient and my actions included:   1. [facility], I spent 15 minutes discussing the possible complications and outcomes of prematurity at this gestational age. I discussed specific complications at this gestational age referencing the need for resuscitation at birth due to respiratory distress which may require mechanical ventilation, CPAP, and surfactant administration. In addition infant may require IV fluids pending establishment of enteral feeds (encouraged breast milk feeding), antibiotics for possible sepsis, temperature support, and continuous monitoring. I also discussed the potential risk of complications such as intracranial hemorrhage, retinopathy, hearing deficit,  and chronic lung disease. I discussed this with parents in detail and they expressed an understanding of the risks and complications of prematurity.   2. I also discussed the expected survival of an infant born at 375w6dwhich is (Very Good). We further discussed that (Very Few) of the neonates born at this age have profound or severe neurological complications and school difficulties. In addition, (Few) of the neonates born at this age will have some for of mild to moderate neurological complications. She expressed an understanding of this information.   3. I informed her that the NICU team would be present at the delivery. She agreed that all appropriate medical measures could be taken to resuscitate her infant at the delivery. She also understood that our team will always be available for any questions that come up during their infant's hospitalization and we will continue to partner with their family to support them through this difficult time. Visitation policy was discussed and all questions were addressed.   Final Impression:  [age].o. female with a 3174w6dP who is threatening to deliver and who now understands the possible complications and prognosis of her infant. The mother agrees with plan for resuscitation and ICU care. Kimberly Ewingiedo's questions were answered. She is planning to try and provide breast milk for her infant.    ______________________________________________________________________  Thank you for asking us [place] participate in the care of this patient. Please do not hesitate to contact us [place] if you are aware of any further ways we can be of assistance.   Sincerely,  Kimberly CircleD Attending Neonatologist   I spent ~35 minutes in consultation time, of  which 15 minutes was spent in direct face to face counseling.

## 2022-02-13 ENCOUNTER — Other Ambulatory Visit (HOSPITAL_COMMUNITY): Payer: Self-pay

## 2022-02-13 DIAGNOSIS — Z3A32 32 weeks gestation of pregnancy: Secondary | ICD-10-CM

## 2022-02-13 DIAGNOSIS — O4700 False labor before 37 completed weeks of gestation, unspecified trimester: Secondary | ICD-10-CM

## 2022-02-13 LAB — GC/CHLAMYDIA PROBE AMP (~~LOC~~) NOT AT ARMC
Chlamydia: NEGATIVE
Comment: NEGATIVE
Comment: NORMAL
Neisseria Gonorrhea: NEGATIVE

## 2022-02-13 MED ORDER — NIFEDIPINE 10 MG PO CAPS
10.0000 mg | ORAL_CAPSULE | Freq: Four times a day (QID) | ORAL | 0 refills | Status: DC | PRN
  Filled 2022-02-13: qty 24, 6d supply, fill #0

## 2022-02-13 NOTE — Discharge Summary (Signed)
Antenatal Physician Discharge Summary  Patient ID: Kimberly Poole MRN: [id number] DOB/AGE: [birth year] 36 y.o.  Admit date: 02/11/2022 Discharge date: 02/13/2022  Admission Diagnoses:  Discharge Diagnoses:   Prenatal Procedures: none  Consults: Neonatology, Maternal Fetal Medicine  Hospital Course:  Kimberly Poole is a 36 y.o. G2P1001 with IUP at [redacted]w[redacted]d admitted for preterm contractions.  She was admitted with contractions, noted to have a cervical exam of fingertip dilation.  No leaking of fluid and no bleeding.  She was initially started on magnesium sulfate for tocolysis and neuroprotection and also received betamethasone x 2 doses.  Her tocolysis was transitioned to intermittent Procardia. Magnesium sulfate was discontinued early due to perceived lower extremity weakness and headache.  She was seen by Neonatology during her stay.  She was observed, fetal heart rate monitoring remained reassuring, and she had no signs/symptoms of progressing preterm labor or other maternal-fetal concerns.    She was deemed stable for discharge to home with outpatient follow up.  Discharge Exam: Temp:  [97.4 F (36.3 C)-98.3 F (36.8 C)] 98.3 F (36.8 C) (11/27 0829) Pulse Rate:  [73-114] 77 (11/27 0829) Resp:  [17-20] 17 (11/27 0829) BP: (104-140)/(67-87) 108/69 (11/27 0829) SpO2:  [96 %-100 %] 98 % (11/27 0829) Physical Examination: CONSTITUTIONAL: Well-developed, well-nourished female in no acute distress.  HENT:  Normocephalic, atraumatic, External right and left ear normal.  EYES: Conjunctivae and EOM are normal. Pupils are equal, round, and reactive to light. No scleral icterus.  NECK: Normal range of motion, supple, no masses SKIN: Skin is warm and dry. No rash noted. Not diaphoretic. No erythema. No pallor. NEUROLGIC: Alert and oriented to person, place, and time. Normal reflexes, muscle tone coordination. No cranial nerve deficit noted. PSYCHIATRIC:  Normal mood and affect. Normal behavior. Normal judgment and thought content. CARDIOVASCULAR: Normal heart rate noted, regular rhythm RESPIRATORY: Effort and breath sounds normal, no problems with respiration noted MUSCULOSKELETAL: Normal range of motion. No edema and no tenderness. 2+ distal pulses. ABDOMEN: Soft, nontender, nondistended, gravid. CERVIX: Dilation: Fingertip Effacement (%): Thick Exam by:: Dorathy Daft, CNM  Significant Diagnostic Studies:  Results for orders placed or performed during the hospital encounter of 02/11/22 (from the past 168 hour(s))  Urinalysis, Routine w reflex microscopic   Collection Time: 02/11/22  8:49 PM  Result Value Ref Range   Color, Urine YELLOW YELLOW   APPearance HAZY (A) CLEAR   Specific Gravity, Urine 1.012 1.005 - 1.030   pH 6.0 5.0 - 8.0   Glucose, UA NEGATIVE NEGATIVE mg/dL   Hgb urine dipstick NEGATIVE NEGATIVE   Bilirubin Urine NEGATIVE NEGATIVE   Ketones, ur 20 (A) NEGATIVE mg/dL   Protein, ur NEGATIVE NEGATIVE mg/dL   Nitrite NEGATIVE NEGATIVE   Leukocytes,Ua NEGATIVE NEGATIVE  Rapid urine drug screen (hospital performed)   Collection Time: 02/11/22  8:49 PM  Result Value Ref Range   Opiates NONE DETECTED NONE DETECTED   Cocaine NONE DETECTED NONE DETECTED   Benzodiazepines NONE DETECTED NONE DETECTED   Amphetamines NONE DETECTED NONE DETECTED   Tetrahydrocannabinol NONE DETECTED NONE DETECTED   Barbiturates NONE DETECTED NONE DETECTED  Lipase, blood   Collection Time: 02/11/22  9:13 PM  Result Value Ref Range   Lipase 40 11 - 51 U/L  Amylase   Collection Time: 02/11/22  9:13 PM  Result Value Ref Range   Amylase 79 28 - 100 U/L  CBC   Collection Time: 02/11/22  9:13 PM  Result Value Ref Range   WBC  9.5 4.0 - 10.5 K/uL   RBC 3.88 3.87 - 5.11 MIL/uL   Hemoglobin 10.7 (L) 12.0 - 15.0 g/dL   HCT 70.6 (L) 23.7 - 62.8 %   MCV 82.7 80.0 - 100.0 fL   MCH 27.6 26.0 - 34.0 pg   MCHC 33.3 30.0 - 36.0 g/dL   RDW 31.5 17.6 -  16.0 %   Platelets 330 150 - 400 K/uL   nRBC 0.2 0.0 - 0.2 %  Comprehensive metabolic panel   Collection Time: 02/11/22  9:13 PM  Result Value Ref Range   Sodium 135 135 - 145 mmol/L   Potassium 3.5 3.5 - 5.1 mmol/L   Chloride 103 98 - 111 mmol/L   CO2 16 (L) 22 - 32 mmol/L   Glucose, Bld 97 70 - 99 mg/dL   BUN 9 6 - 20 mg/dL   Creatinine, Ser 7.37 0.44 - 1.00 mg/dL   Calcium 9.1 8.9 - 10.6 mg/dL   Total Protein 6.6 6.5 - 8.1 g/dL   Albumin 2.8 (L) 3.5 - 5.0 g/dL   AST 29 15 - 41 U/L   ALT 29 0 - 44 U/L   Alkaline Phosphatase 139 (H) 38 - 126 U/L   Total Bilirubin 0.4 0.3 - 1.2 mg/dL   GFR, Estimated >26 >94 mL/min   Anion gap 16 (H) 5 - 15  Type and screen [facility]   Collection Time: 02/11/22  9:13 PM  Result Value Ref Range   ABO/RH(D) O POS    Antibody Screen NEG    Sample Expiration      02/14/2022,2359 Performed at [facility], [address] [place], [place] [address]   Wet prep, genital   Collection Time: 02/11/22 11:49 PM  Result Value Ref Range   Yeast Wet Prep HPF POC NONE SEEN NONE SEEN   Trich, Wet Prep NONE SEEN NONE SEEN   Clue Cells Wet Prep HPF POC NONE SEEN NONE SEEN   WBC, Wet Prep HPF POC <10 <10   Sperm NONE SEEN   GC/Chlamydia probe amp ([place])not at [facility]   Collection Time: 02/11/22 11:49 PM  Result Value Ref Range   Neisseria Gonorrhea Negative    Chlamydia Negative    Comment Normal Reference Ranger Chlamydia - Negative    Comment      Normal Reference Range Neisseria Gonorrhea - Negative  Fern Test   Collection Time: 02/12/22 12:10 AM  Result Value Ref Range   POCT Fern Test Negative = intact amniotic membranes   RPR   Collection Time: 02/12/22  1:03 AM  Result Value Ref Range   RPR Ser Ql NON REACTIVE NON REACTIVE  Magnesium   Collection Time: 02/12/22  7:49 AM  Result Value Ref Range   Magnesium 5.7 (H) 1.7 - 2.4 mg/dL  Magnesium   Collection Time: 02/12/22  3:11 PM  Result Value Ref Range    Magnesium 6.1 (HH) 1.7 - 2.4 mg/dL   US ABDOMEN LIMITED RUQ (LIVER/GB)  Result Date: 01/26/2022 CLINICAL DATA:  Right upper quadrant pain and vomiting. EXAM: ULTRASOUND ABDOMEN LIMITED RIGHT UPPER QUADRANT COMPARISON:  MRI abdomen 07/29/2021 FINDINGS: Gallbladder: No gallstones or wall thickening visualized. No sonographic Murphy sign noted by sonographer. Common bile duct: Diameter: 3 mm Liver: No focal lesion identified. Within normal limits in parenchymal echogenicity. Portal vein is patent on color Doppler imaging with normal direction of blood flow towards the liver. Other: None. IMPRESSION: Unremarkable study. No findings to explain the patient's  history of pain. Electronically Signed   By: Kennith CenterEric  Mansell M.D.   On: 01/26/2022 07:37    Future Appointments  Date Time Provider Department Center  02/15/2022 11:15 AM [facility] NURSE [facility]  02/15/2022 11:30 AM [facility] US[facility]    Discharge Condition: Stable  Discharge disposition: 01-Home or Self Care       Discharge Instructions     Discharge activity:  No Restrictions   Complete by: As directed    Discharge diet:  No restrictions   Complete by: As directed    Notify physician for a general feeling that "something is not right"   Complete by: As directed    Notify physician for increase or change in vaginal discharge   Complete by: As directed    Notify physician for intestinal cramps, with or without diarrhea, sometimes described as "gas pain"   Complete by: As directed    Notify physician for leaking of fluid   Complete by: As directed    Notify physician for low, dull backache, unrelieved by heat or Tylenol   Complete by: As directed    Notify physician for menstrual like cramps   Complete by: As directed    Notify physician for pelvic pressure   Complete by: As directed    Notify physician for uterine contractions.  These may be painless and feel like the uterus is tightening or the baby is  "balling up"    Complete by: As directed    Notify physician for vaginal bleeding   Complete by: As directed    PRETERM LABOR:  Includes any of the follwing symptoms that occur between 20 - [redacted] weeks gestation.  If these symptoms are not stopped, preterm labor can result in preterm delivery, placing your baby at risk   Complete by: As directed    Sexual Activity:     Complete by: As directed    Pelvic rest until 35 weeks      Allergies as of 02/13/2022       Reactions   Diclofenac Itching   Other Itching, Swelling   Nuts-causes itching tongue and swelling of mouth        Medication List     STOP taking these medications    promethazine 25 MG tablet Commonly known as: PHENERGAN       TAKE these medications    acetaminophen 650 MG CR tablet Commonly known as: TYLENOL Take 650 mg by mouth every 8 (eight) hours as needed for pain.   citalopram 20 MG tablet Commonly known as: CELEXA TAKE 2 TABLETS (40 MG TOTAL) BY MOUTH AT BEDTIME.   cyclobenzaprine 10 MG tablet Commonly known as: FLEXERIL Take 1 tablet (10 mg total) by mouth 2 (two) times daily as needed for muscle spasms.   hydrOXYzine 25 MG tablet Commonly known as: ATARAX Take 1 tablet (25 mg total) by mouth daily with breakfast.   NIFEdipine 10 MG capsule Commonly known as: PROCARDIA Take 1 capsule (10 mg total) by mouth every 6 (six) hours as needed (preterm contractions).   pantoprazole 20 MG tablet Commonly known as: PROTONIX Take 1 tablet (20 mg total) by mouth 2 (two) times daily before a meal. What changed: Another medication with the same name was removed. Continue taking this medication, and follow the directions you see here.   PrePLUS 27-1 MG Tabs Take 1 tablet by mouth daily.   VITAMIN C PO Take by mouth.        Follow-up Information  [facility] [employer] [facility]. Go on 02/15/2022.   Specialty: Obstetrics and Gynecology Why: routine OB appointment Contact  information: [address] [place] [address] [phone]        [facility] [employer] [facility]. Schedule an appointment as soon as possible for a visit in 1 week(s).   Specialty: Obstetrics and Gynecology Contact information: [address] [place] [address] [phone]                Total discharge time: 30 minutes   Signed: Warden Fillers M.D. 02/13/2022, 11:32 AM

## 2022-02-15 ENCOUNTER — Other Ambulatory Visit: Payer: Self-pay | Admitting: Maternal & Fetal Medicine

## 2022-02-15 ENCOUNTER — Ambulatory Visit: Payer: Self-pay | Admitting: *Deleted

## 2022-02-15 ENCOUNTER — Other Ambulatory Visit: Payer: Self-pay | Admitting: *Deleted

## 2022-02-15 ENCOUNTER — Telehealth: Payer: Self-pay | Admitting: Family Medicine

## 2022-02-15 ENCOUNTER — Ambulatory Visit: Payer: Self-pay | Attending: Obstetrics

## 2022-02-15 VITALS — BP 98/60 | HR 85

## 2022-02-15 DIAGNOSIS — O283 Abnormal ultrasonic finding on antenatal screening of mother: Secondary | ICD-10-CM

## 2022-02-15 DIAGNOSIS — O0990 Supervision of high risk pregnancy, unspecified, unspecified trimester: Secondary | ICD-10-CM | POA: Diagnosis present

## 2022-02-15 DIAGNOSIS — Z3A32 32 weeks gestation of pregnancy: Secondary | ICD-10-CM

## 2022-02-15 DIAGNOSIS — O0932 Supervision of pregnancy with insufficient antenatal care, second trimester: Secondary | ICD-10-CM

## 2022-02-15 DIAGNOSIS — O34219 Maternal care for unspecified type scar from previous cesarean delivery: Secondary | ICD-10-CM

## 2022-02-15 DIAGNOSIS — F1991 Other psychoactive substance use, unspecified, in remission: Secondary | ICD-10-CM

## 2022-02-15 DIAGNOSIS — O09522 Supervision of elderly multigravida, second trimester: Secondary | ICD-10-CM

## 2022-02-15 DIAGNOSIS — O0933 Supervision of pregnancy with insufficient antenatal care, third trimester: Secondary | ICD-10-CM

## 2022-02-15 DIAGNOSIS — O09523 Supervision of elderly multigravida, third trimester: Secondary | ICD-10-CM | POA: Insufficient documentation

## 2022-02-15 DIAGNOSIS — Z9189 Other specified personal risk factors, not elsewhere classified: Secondary | ICD-10-CM

## 2022-02-15 DIAGNOSIS — Z3689 Encounter for other specified antenatal screening: Secondary | ICD-10-CM

## 2022-02-15 DIAGNOSIS — O99343 Other mental disorders complicating pregnancy, third trimester: Secondary | ICD-10-CM

## 2022-02-15 NOTE — Telephone Encounter (Signed)
Refill on Cyclobenzaprine

## 2022-02-16 NOTE — Telephone Encounter (Signed)
Per chart review, pt last seen by Dr. Donavan Foil on 11/27 for discharge summary following admit on 11/25 to [facility]. Message sent to Dr. Donavan Foil re: pt refill request for Cyclobenzaprine 10 mg.

## 2022-02-19 ENCOUNTER — Other Ambulatory Visit: Payer: Self-pay | Admitting: Obstetrics and Gynecology

## 2022-02-19 MED ORDER — CYCLOBENZAPRINE HCL 10 MG PO TABS: 10.0000 mg | ORAL_TABLET | Freq: Two times a day (BID) | ORAL | 0 refills | Status: DC | PRN

## 2022-02-19 NOTE — Progress Notes (Signed)
Refill of flexeril sent

## 2022-02-20 NOTE — Telephone Encounter (Signed)
Per chart review, med was refilled by Dr. Donavan Foil on 02/19/22.   Maureen Ralphs RN

## 2022-02-22 ENCOUNTER — Ambulatory Visit: Payer: Self-pay | Admitting: *Deleted

## 2022-02-22 ENCOUNTER — Encounter: Payer: Self-pay | Admitting: Family Medicine

## 2022-02-22 ENCOUNTER — Other Ambulatory Visit: Payer: Self-pay

## 2022-02-22 ENCOUNTER — Ambulatory Visit: Payer: Self-pay | Attending: Maternal & Fetal Medicine

## 2022-02-22 ENCOUNTER — Ambulatory Visit (INDEPENDENT_AMBULATORY_CARE_PROVIDER_SITE_OTHER): Payer: Self-pay | Admitting: Family Medicine

## 2022-02-22 VITALS — BP 120/73 | HR 73 | Wt 170.8 lb

## 2022-02-22 VITALS — BP 101/67 | HR 103

## 2022-02-22 DIAGNOSIS — O99343 Other mental disorders complicating pregnancy, third trimester: Secondary | ICD-10-CM

## 2022-02-22 DIAGNOSIS — O0990 Supervision of high risk pregnancy, unspecified, unspecified trimester: Secondary | ICD-10-CM

## 2022-02-22 DIAGNOSIS — O09523 Supervision of elderly multigravida, third trimester: Secondary | ICD-10-CM

## 2022-02-22 DIAGNOSIS — Z3A33 33 weeks gestation of pregnancy: Secondary | ICD-10-CM

## 2022-02-22 DIAGNOSIS — Z3689 Encounter for other specified antenatal screening: Secondary | ICD-10-CM | POA: Insufficient documentation

## 2022-02-22 DIAGNOSIS — O283 Abnormal ultrasonic finding on antenatal screening of mother: Secondary | ICD-10-CM

## 2022-02-22 DIAGNOSIS — F3289 Other specified depressive episodes: Secondary | ICD-10-CM

## 2022-02-22 DIAGNOSIS — O4700 False labor before 37 completed weeks of gestation, unspecified trimester: Secondary | ICD-10-CM

## 2022-02-22 DIAGNOSIS — Z603 Acculturation difficulty: Secondary | ICD-10-CM

## 2022-02-22 DIAGNOSIS — O34219 Maternal care for unspecified type scar from previous cesarean delivery: Secondary | ICD-10-CM

## 2022-02-22 DIAGNOSIS — O0933 Supervision of pregnancy with insufficient antenatal care, third trimester: Secondary | ICD-10-CM | POA: Insufficient documentation

## 2022-02-22 DIAGNOSIS — Z9189 Other specified personal risk factors, not elsewhere classified: Secondary | ICD-10-CM | POA: Insufficient documentation

## 2022-02-22 DIAGNOSIS — F1991 Other psychoactive substance use, unspecified, in remission: Secondary | ICD-10-CM

## 2022-02-22 DIAGNOSIS — O0993 Supervision of high risk pregnancy, unspecified, third trimester: Secondary | ICD-10-CM

## 2022-02-22 MED ORDER — HYDROXYZINE HCL 25 MG PO TABS: 25.0000 mg | ORAL_TABLET | Freq: Every day | ORAL | 5 refills | Status: DC

## 2022-02-22 NOTE — Patient Instructions (Signed)
Eleccin del mtodo anticonceptivo Contraception Choices La anticoncepcin, o los mtodos anticonceptivos, hace referencia a los mtodos o dispositivos que evitan el embarazo. Mtodos hormonales  Implante anticonceptivo Un implante anticonceptivo consiste en un tubo delgado de plstico que contiene una hormona que evita el embarazo. Es diferente de un dispositivo intrauterino (DIU). Un mdico lo inserta en la parte superior del brazo. Los implantes pueden ser eficaces durante un mximo de 3 aos. Inyecciones de progestina sola Las inyecciones de progestina sola contienen progestina, una forma sinttica de la hormona progesterona. Un mdico las administra cada 3 meses. Pldoras anticonceptivas Las pldoras anticonceptivas son pastillas que contienen hormonas que evitan el embarazo. Deben tomarse una vez al da, preferentemente a la misma hora cada da. Se necesita una receta para utilizar este mtodo anticonceptivo. Parche anticonceptivo El parche anticonceptivo contiene hormonas que evitan el embarazo. Se coloca en la piel, debe cambiarse una vez a la semana durante tres semanas y debe retirarse en la cuarta semana. Se necesita una receta para utilizar este mtodo anticonceptivo. Anillo vaginal Un anillo vaginal contiene hormonas que evitan el embarazo. Se coloca en la vagina durante tres semanas y se retira en la cuarta semana. Luego se repite el proceso con un anillo nuevo. Se necesita una receta para utilizar este mtodo anticonceptivo. Anticonceptivo de emergencia Los anticonceptivos de emergencia son mtodos para evitar un embarazo despus de tener sexo sin proteccin. Vienen en forma de pldora y pueden tomarse hasta 5 das despus de tener sexo. Funcionan mejor cuando se toman lo ms pronto posible luego de tener sexo. La mayora de los anticonceptivos de emergencia estn disponibles sin receta mdica. Este mtodo no debe utilizarse como el nico mtodo anticonceptivo. Mtodos de  barrera  Condn masculino Un condn masculino es una vaina delgada que se coloca sobre el pene durante el sexo. Los condones evitan que el esperma ingrese en el cuerpo de la mujer. Pueden utilizarse con un una sustancia que mata a los espermatozoides (espermicida) para aumentar la efectividad. Deben desecharse despus de un uso. Condn femenino Un condn femenino es una vaina blanda y holgada que se coloca en la vagina antes de tener sexo. El condn evita que el esperma ingrese en el cuerpo de la mujer. Deben desecharse despus de un uso. Diafragma Un diafragma es una barrera blanda con forma de cpula. Se inserta en la vagina antes del sexo, junto con un espermicida. El diafragma bloquea el ingreso de esperma en el tero, y el espermicida mata a los espermatozoides. El diafragma debe permanecer en la vagina durante 6 a 8 horas despus de tener sexo y debe retirarse en el plazo de las 24 horas. Un diafragma es recetado y colocado por un mdico. Debe reemplazarse cada 1 a 2 aos, despus de dar a luz, de aumentar ms de 15lb (6.8kg) y de una ciruga plvica. Capuchn cervical Un capuchn cervical es una copa redonda y blanda de ltex o plstico que se coloca en el cuello uterino. Se inserta en la vagina antes del sexo, junto con un espermicida. Bloquea el ingreso del esperma en el tero. El capuchn debe permanecer en el lugar durante 6 a 8 horas despus de tener sexo y debe retirarse en el plazo de las 48 horas. Un capuchn cervical debe ser recetado y colocado por un mdico. Debe reemplazarse cada 2aos. Esponja Una esponja es una pieza blanda y circular de espuma de poliuretano que contiene espermicida. La esponja ayuda a bloquear el ingreso de esperma en el tero, y el espermicida   mata a los espermatozoides. Para utilizarla, debe humedecerla e insertarla en la vagina. Debe insertarse antes de tener sexo, debe permanecer dentro al menos durante 6 horas despus de tener sexo y debe retirarse y  desecharse en el plazo de las 30 horas. Espermicidas Los espermicidas son sustancias qumicas que matan o bloquean al esperma y no lo dejan ingresar al cuello uterino y al tero. Vienen en forma de crema, gel, supositorio, espuma o comprimido. Un espermicida debe insertarse en la vagina con un aplicador al menos 10 o 15 minutos antes de tener sexo para dar tiempo a que surta efecto. El proceso debe repetirse cada vez que tenga sexo. Los espermicidas no requieren receta mdica. Anticonceptivos intrauterinos Dispositivo intrauterino (DIU) Un DIU es un dispositivo en forma de T que se coloca en el tero. Existen dos tipos: DIU hormonal.Este tipo contiene progestina, una forma sinttica de la hormona progesterona. Este tipo puede permanecer colocado durante 3 a 5 aos. DIU de cobre.Este tipo est recubierto con un alambre de cobre. Puede permanecer colocado durante 10 aos. Mtodos anticonceptivos permanentes Ligadura de trompas en la mujer En este mtodo, se sellan, atan u obstruyen las trompas de Falopio durante una ciruga para evitar que el vulo descienda hacia el tero. Esterilizacin histeroscpica En este mtodo, se coloca un implante pequeo y flexible dentro de cada trompa de Falopio. Los implantes hacen que se forme un tejido cicatricial en las trompas de Falopio y que las obstruya para que el espermatozoide no pueda llegar al vulo. El procedimiento demora alrededor de 3 meses para que sea efectivo. Debe utilizarse otro mtodo anticonceptivo durante esos 3 meses. Esterilizacin masculina Este es un procedimiento que consiste en atar los conductos que transportan el esperma (vasectoma). Luego del procedimiento, el hombre puede eyacular lquido (semen). Debe utilizarse otro mtodo anticonceptivo durante 3 meses despus del procedimiento. Mtodos de planificacin natural Planificacin familiar natural En este mtodo, la pareja no tiene sexo durante los das en que la mujer podra quedar  embarazada. Mtodo calendario En este mtodo, la mujer realiza un seguimiento de la duracin de cada ciclo menstrual, identifica los das en los que se puede producir un embarazo y no tiene sexo durante esos das. Mtodo de la ovulacin En este mtodo, la pareja evita tener sexo durante la ovulacin. Mtodo sintotrmico Este mtodo implica no tener sexo durante la ovulacin. Normalmente, la mujer comprueba la ovulacin al observar cambios en su temperatura y en la consistencia del moco cervical. Mtodo posovulacin En este mtodo, la pareja espera a que finalice la ovulacin para tener sexo. Dnde buscar ms informacin Centers for Disease Control and Prevention (Centros para el Control y la Prevencin de Enfermedades): www.cdc.gov Resumen La anticoncepcin, o los mtodos anticonceptivos, hace referencia a los mtodos o dispositivos que evitan el embarazo. Los mtodos anticonceptivos hormonales incluyen implantes, inyecciones, pastillas, parches, anillos vaginales y anticonceptivos de emergencia. Los mtodos anticonceptivos de barrera pueden incluir condones masculinos, condones femeninos, diafragmas, capuchones cervicales, esponjas y espermicidas. Existen dos tipos de DIU (dispositivo intrauterino). Un DIU puede colocarse en el tero de una mujer para evitar el embarazo durante 3 a 5 aos. La esterilizacin permanente puede realizarse mediante un procedimiento tanto en los hombres como en las mujeres. Los mtodos de planificacin familiar natural implican no tener sexo durante los das en que la mujer podra quedar embarazada. Esta informacin no tiene como fin reemplazar el consejo del mdico. Asegrese de hacerle al mdico cualquier pregunta que tenga. Document Revised: 10/07/2019 Document Reviewed: 10/07/2019 Elsevier Patient Education    2023 Elsevier Inc.  

## 2022-02-22 NOTE — Addendum Note (Signed)
Addended by: Merian Capron on: 02/22/2022 02:32 PM   Modules accepted: Orders

## 2022-02-22 NOTE — Progress Notes (Signed)
Subjective:  Kimberly Poole is a 36 y.o. G2P1001 at [redacted]w[redacted]d being seen today for ongoing prenatal care.  She is currently monitored for the following issues for this high-risk pregnancy and has Language barrier, cultural differences; Supervision of high risk pregnancy, antepartum; AMA (advanced maternal age) multigravida 79+; History of drug overdose; History of drug use; Migraines; Depression; Previous cesarean delivery affecting pregnancy; Nuchal fold thickening on prenatal ultrasound; and Preterm uterine contractions, antepartum on their problem list.  Patient reports no complaints.  Contractions: Irritability. Vag. Bleeding: None.  Movement: Present. Denies leaking of fluid.   The following portions of the patient's history were reviewed and updated as appropriate: allergies, current medications, past family history, past medical history, past social history, past surgical history and problem list. Problem list updated.  Objective:   Vitals:   02/22/22 1009  BP: 120/73  Pulse: 73  Weight: 170 lb 12.8 oz (77.5 kg)    Fetal Status: Fetal Heart Rate (bpm): 133   Movement: Present     General:  Alert, oriented and cooperative. Patient is in no acute distress.  Skin: Skin is warm and dry. No rash noted.   Cardiovascular: Normal heart rate noted  Respiratory: Normal respiratory effort, no problems with respiration noted  Abdomen: Soft, gravid, appropriate for gestational age. Pain/Pressure: Present     Pelvic: Vag. Bleeding: None     Cervical exam deferred        Extremities: Normal range of motion.     Mental Status: Normal mood and affect. Normal behavior. Normal judgment and thought content.   Urinalysis:      Assessment and Plan:  Pregnancy: G2P1001 at [redacted]w[redacted]d  1. Supervision of high risk pregnancy, antepartum BP and FHR normal Getting third trimester labs today Will give letter for [facility] at [facility]  2. Previous cesarean delivery affecting pregnancy Desires  RCS+BTL Discussed BTL in detail, would like to proceed with salpingectomy at time of cesarean Patient desires permanent sterilization.  Other reversible forms of contraception were discussed with patient; she declines all other modalities. Risks of procedure discussed with patient including but not limited to: risk of regret, permanence of method, bleeding, infection, injury to surrounding organs and need for additional procedures.  Failure risk of about 1% with increased risk of ectopic gestation if pregnancy occurs was also discussed with patient.  Also discussed possibility of post-tubal pain syndrome. The patient concurred with the proposed plan. Message sent to schedule surgery  3. Preterm uterine contractions, antepartum Admitted to hospital two weeks ago for this, monitored and did not make cervical change Felt like she had some yesterday but took her Procardia and they stopped, none right now  4. Language barrier, cultural differences Spanish  5. Other depression Seen by me in MAU last month, at which time she had endorsed HI against partner's ex Sent to [facility] but never went Today reports she feels about the same as last time I saw her Taking Celexa Strongly, strongly encouraged to go across the street and establish care with [facility] since she has an appt for [place] later today and needs to be around here anyways, she will think about it  6. Multigravida of advanced maternal age in third trimester   7. Nuchal fold thickening on prenatal ultrasound Noted on early [place], does not apear thickened at present Following w MFM, qweek BPP's have been reassuring to date Declined NIPT  Preterm labor symptoms and general obstetric precautions including but not limited to vaginal bleeding, contractions, leaking of  fluid and fetal movement were reviewed in detail with the patient. Please refer to After Visit Summary for other counseling recommendations.  Return in 2 weeks (on 03/08/2022) for [facility], ob  visit.   Venora Maples, MD

## 2022-02-23 LAB — GLUCOSE TOLERANCE, 2 HOURS W/ 1HR
Glucose, 1 hour: 124 mg/dL (ref 70–179)
Glucose, 2 hour: 89 mg/dL (ref 70–152)
Glucose, Fasting: 69 mg/dL — ABNORMAL LOW (ref 70–91)

## 2022-02-23 LAB — CBC
Hematocrit: 31.1 % — ABNORMAL LOW (ref 34.0–46.6)
Hemoglobin: 10.4 g/dL — ABNORMAL LOW (ref 11.1–15.9)
MCH: 26.5 pg — ABNORMAL LOW (ref 26.6–33.0)
MCHC: 33.4 g/dL (ref 31.5–35.7)
MCV: 79 fL (ref 79–97)
Platelets: 303 10*3/uL (ref 150–450)
RBC: 3.92 x10E6/uL (ref 3.77–5.28)
RDW: 13.4 % (ref 11.7–15.4)
WBC: 8.1 10*3/uL (ref 3.4–10.8)

## 2022-02-23 LAB — RPR: RPR Ser Ql: NONREACTIVE

## 2022-02-23 LAB — HIV ANTIBODY (ROUTINE TESTING W REFLEX): HIV Screen 4th Generation wRfx: NONREACTIVE

## 2022-02-27 ENCOUNTER — Ambulatory Visit: Payer: Self-pay

## 2022-02-27 ENCOUNTER — Other Ambulatory Visit: Payer: Self-pay

## 2022-03-06 ENCOUNTER — Ambulatory Visit: Payer: Self-pay

## 2022-03-06 ENCOUNTER — Ambulatory Visit: Payer: Self-pay | Attending: Obstetrics and Gynecology

## 2022-03-06 ENCOUNTER — Encounter (HOSPITAL_COMMUNITY): Payer: Self-pay | Admitting: Family Medicine

## 2022-03-06 ENCOUNTER — Inpatient Hospital Stay (HOSPITAL_COMMUNITY)
Admission: AD | Admit: 2022-03-06 | Discharge: 2022-03-06 | Disposition: A | Discharge status: Home / Self Care | Payer: Self-pay | Attending: Obstetrics & Gynecology | Admitting: Obstetrics & Gynecology

## 2022-03-06 DIAGNOSIS — O479 False labor, unspecified: Secondary | ICD-10-CM

## 2022-03-06 DIAGNOSIS — R519 Headache, unspecified: Secondary | ICD-10-CM | POA: Insufficient documentation

## 2022-03-06 DIAGNOSIS — Z87891 Personal history of nicotine dependence: Secondary | ICD-10-CM | POA: Insufficient documentation

## 2022-03-06 DIAGNOSIS — O4703 False labor before 37 completed weeks of gestation, third trimester: Secondary | ICD-10-CM | POA: Insufficient documentation

## 2022-03-06 DIAGNOSIS — Z3A35 35 weeks gestation of pregnancy: Secondary | ICD-10-CM | POA: Insufficient documentation

## 2022-03-06 DIAGNOSIS — O4693 Antepartum hemorrhage, unspecified, third trimester: Secondary | ICD-10-CM | POA: Insufficient documentation

## 2022-03-06 DIAGNOSIS — O26893 Other specified pregnancy related conditions, third trimester: Secondary | ICD-10-CM | POA: Insufficient documentation

## 2022-03-06 LAB — URINALYSIS, ROUTINE W REFLEX MICROSCOPIC
Bilirubin Urine: NEGATIVE
Glucose, UA: NEGATIVE mg/dL
Ketones, ur: NEGATIVE mg/dL
Nitrite: NEGATIVE
Protein, ur: NEGATIVE mg/dL
Specific Gravity, Urine: 1.011 (ref 1.005–1.030)
pH: 6 (ref 5.0–8.0)

## 2022-03-06 MED ORDER — CYCLOBENZAPRINE HCL 10 MG PO TABS: 10.0000 mg | ORAL_TABLET | Freq: Two times a day (BID) | ORAL | 1 refills | Status: DC | PRN

## 2022-03-06 MED ORDER — CYCLOBENZAPRINE HCL 5 MG PO TABS
10.0000 mg | ORAL_TABLET | Freq: Once | ORAL | Status: AC
  Administered 2022-03-06: 10 mg via ORAL
  Filled 2022-03-06: qty 2

## 2022-03-06 NOTE — MAU Note (Signed)
.  Kimberly Poole is a 36 y.o. at [redacted]w[redacted]d here in MAU reporting: lower abdominal and back contractions that began last night and have since gotten worse (9/10). She also reports VB that happens when she urinates. Pt reports "mucous-mixed bleeding" when going to the bathroom. RN notes no blood on pad that patient arrived to hospital wearing and no blood on toilet paper after wiping while using the restroom. Patient denies LOF and reports DFM since last night when the pain started. Reports daily HA and visual disturbances with position changes that have been on-going. She reports that tylenol usually relives the HA, but she has stopped taking the tylenol due to GI upset. Denies abnormal swelling or RUQ/epigastric pain.   LMP: N/A Onset of complaint: Last night Pain score: 9/10 Vitals:   03/06/22 1513  BP: (!) 106/91  Pulse: 77  Resp: (!) 22  Temp: 97.9 F (36.6 C)  SpO2: 99%     FHT:151  Lab orders placed from triage:  UA

## 2022-03-06 NOTE — MAU Provider Note (Signed)
History     CSN: [id number]  Arrival date and time: 03/06/22 1448   Event Date/Time   First Provider Initiated Contact with Patient 03/06/22 1523      Chief Complaint  Patient presents with   Contractions   Vaginal Bleeding   HPI  Kimberly Poole is a 36 y.o. G2P1001 at [redacted]w[redacted]d who presents for evaluation of abdominal pain, vaginal bleeding and headache. Patient reports she has been having contractions since she was discharged after her last MAU visit. She ran out of her flexeril which usually helps. She reports she saw some blood when she wiped and got nervous. She also reports a constant headache. Patient rates the pain as a 7/10 and has tried tylenol for the pain with no relief. She denies any discharge and leaking of fluid. Denies any constipation, diarrhea or any urinary complaints. Reports normal fetal movement.   OB History     Gravida  2   Para  1   Term  1   Preterm      AB      Living  1      SAB      IAB      Ectopic      Multiple      Live Births  1           Past Medical History:  Diagnosis Date   Anxiety    Depression    Gallstones    Hypotension    Kidney stones    Migraines     Past Surgical History:  Procedure Laterality Date   CESAREAN SECTION      Family History  Problem Relation Age of Onset   Lung disease Mother    Lung disease Father     Social History   Tobacco Use   Smoking status: Former    Types: Cigarettes    Quit date: 2020    Years since quitting: 3.9   Smokeless tobacco: Never  Vaping Use   Vaping Use: Never used  Substance Use Topics   Alcohol use: Not Currently    Comment: celebrations, stopped drinking alcohol 2022   Drug use: Not Currently    Types: Cocaine    Comment: last used 03/20/2017    Allergies:  Allergies  Allergen Reactions   Diclofenac Itching   Other Itching and Swelling    Nuts-causes itching tongue and swelling of mouth    No medications prior to  admission.    Review of Systems  Constitutional: Negative.  Negative for fatigue and fever.  HENT: Negative.    Respiratory: Negative.  Negative for shortness of breath.   Cardiovascular: Negative.  Negative for chest pain.  Gastrointestinal:  Positive for abdominal pain. Negative for constipation, diarrhea, nausea and vomiting.  Genitourinary:  Positive for vaginal bleeding. Negative for dysuria and vaginal discharge.  Neurological:  Positive for headaches. Negative for dizziness.   Physical Exam   Blood pressure 102/78, pulse 75, temperature 97.9 F (36.6 C), temperature source Oral, resp. rate (!) 22, last menstrual period 06/18/2021, SpO2 100 %.  Patient Vitals for the past 24 hrs:  BP Temp Temp src Pulse Resp SpO2  03/06/22 1625 102/78 -- -- 75 -- 100 %  03/06/22 1525 104/77 -- -- 80 -- --  03/06/22 1513 (!) 106/91 97.9 F (36.6 C) Oral 77 (!) 22 99 %    Physical Exam Vitals and nursing note reviewed.  Constitutional:      General: She is not in  acute distress.    Appearance: She is well-developed.  HENT:     Head: Normocephalic.  Eyes:     Pupils: Pupils are equal, round, and reactive to light.  Cardiovascular:     Rate and Rhythm: Normal rate and regular rhythm.     Heart sounds: Normal heart sounds.  Pulmonary:     Effort: Pulmonary effort is normal. No respiratory distress.     Breath sounds: Normal breath sounds.  Abdominal:     General: Bowel sounds are normal. There is no distension.     Palpations: Abdomen is soft.     Tenderness: There is no abdominal tenderness.  Skin:    General: Skin is warm and dry.  Neurological:     Mental Status: She is alert and oriented to person, place, and time.  Psychiatric:        Mood and Affect: Mood normal.        Behavior: Behavior normal.        Thought Content: Thought content normal.        Judgment: Judgment normal.     Fetal Tracing:  Baseline: 135 Variability: moderate Accels: 15x15 Decels:  none  Toco: 2-10  Dilation: Fingertip Effacement (%): Thick Exam by:: Cleone Slim, CNM   MAU Course  Procedures  Results for orders placed or performed during the hospital encounter of 03/06/22 (from the past 24 hour(s))  Urinalysis, Routine w reflex microscopic     Status: Abnormal   Collection Time: 03/06/22  3:33 PM  Result Value Ref Range   Color, Urine YELLOW YELLOW   APPearance HAZY (A) CLEAR   Specific Gravity, Urine 1.011 1.005 - 1.030   pH 6.0 5.0 - 8.0   Glucose, UA NEGATIVE NEGATIVE mg/dL   Hgb urine dipstick MODERATE (A) NEGATIVE   Bilirubin Urine NEGATIVE NEGATIVE   Ketones, ur NEGATIVE NEGATIVE mg/dL   Protein, ur NEGATIVE NEGATIVE mg/dL   Nitrite NEGATIVE NEGATIVE   Leukocytes,Ua TRACE (A) NEGATIVE   RBC / HPF 0-5 0 - 5 RBC/hpf   WBC, UA 0-5 0 - 5 WBC/hpf   Bacteria, UA FEW (A) NONE SEEN   Squamous Epithelial / LPF 0-5 0 - 5     MDM Labs ordered and reviewed.   UA Flexeril PO  Patient reports resolution of headache and requested discharge home. Offered IV fluids for UCs and patient declines.   One elevated BP on arrival but patient hyperventilating and unable to sit still. All repeat's within normal limits   Assessment and Plan   1. Pregnancy headache in third trimester   2. Braxton Hick's contraction   3. [redacted] weeks gestation of pregnancy     -Discharge home in stable condition -Rx for flexeril sent to pharmacy -Preterm labor precautions discussed -Patient advised to follow-up with OB as scheduled for prenatal care -Patient may return to MAU as needed or if her condition were to change or worsen  Rolm Bookbinder, CNM 03/06/2022, 3:23 PM

## 2022-03-06 NOTE — Discharge Instructions (Signed)

## 2022-03-12 ENCOUNTER — Encounter (HOSPITAL_COMMUNITY)
Admission: AD | Disposition: A | Discharge status: Home / Self Care | Payer: Self-pay | Source: Home / Self Care | Attending: Obstetrics and Gynecology

## 2022-03-12 ENCOUNTER — Inpatient Hospital Stay (HOSPITAL_COMMUNITY): Payer: Medicaid Other | Admitting: Certified Registered Nurse Anesthetist

## 2022-03-12 ENCOUNTER — Inpatient Hospital Stay (HOSPITAL_COMMUNITY)
Admission: AD | Admit: 2022-03-12 | Discharge: 2022-03-15 | DRG: 784 | Disposition: A | Discharge status: Home / Self Care | Payer: Medicaid Other | Attending: Obstetrics and Gynecology | Admitting: Obstetrics and Gynecology

## 2022-03-12 ENCOUNTER — Other Ambulatory Visit: Payer: Self-pay

## 2022-03-12 ENCOUNTER — Encounter (HOSPITAL_COMMUNITY): Payer: Self-pay | Admitting: Obstetrics and Gynecology

## 2022-03-12 DIAGNOSIS — O99344 Other mental disorders complicating childbirth: Secondary | ICD-10-CM

## 2022-03-12 DIAGNOSIS — Z302 Encounter for sterilization: Secondary | ICD-10-CM

## 2022-03-12 DIAGNOSIS — O34211 Maternal care for low transverse scar from previous cesarean delivery: Secondary | ICD-10-CM

## 2022-03-12 DIAGNOSIS — D62 Acute posthemorrhagic anemia: Secondary | ICD-10-CM | POA: Diagnosis not present

## 2022-03-12 DIAGNOSIS — Z87891 Personal history of nicotine dependence: Secondary | ICD-10-CM | POA: Diagnosis not present

## 2022-03-12 DIAGNOSIS — Z3A35 35 weeks gestation of pregnancy: Secondary | ICD-10-CM

## 2022-03-12 DIAGNOSIS — O0990 Supervision of high risk pregnancy, unspecified, unspecified trimester: Secondary | ICD-10-CM

## 2022-03-12 DIAGNOSIS — O42013 Preterm premature rupture of membranes, onset of labor within 24 hours of rupture, third trimester: Secondary | ICD-10-CM

## 2022-03-12 DIAGNOSIS — Z9851 Tubal ligation status: Secondary | ICD-10-CM

## 2022-03-12 DIAGNOSIS — Z98891 History of uterine scar from previous surgery: Principal | ICD-10-CM

## 2022-03-12 DIAGNOSIS — O09523 Supervision of elderly multigravida, third trimester: Secondary | ICD-10-CM

## 2022-03-12 DIAGNOSIS — O9081 Anemia of the puerperium: Secondary | ICD-10-CM | POA: Diagnosis not present

## 2022-03-12 DIAGNOSIS — Z23 Encounter for immunization: Secondary | ICD-10-CM | POA: Diagnosis not present

## 2022-03-12 DIAGNOSIS — O34219 Maternal care for unspecified type scar from previous cesarean delivery: Secondary | ICD-10-CM

## 2022-03-12 DIAGNOSIS — O09529 Supervision of elderly multigravida, unspecified trimester: Secondary | ICD-10-CM

## 2022-03-12 DIAGNOSIS — F418 Other specified anxiety disorders: Secondary | ICD-10-CM

## 2022-03-12 LAB — CBC
HCT: 33.9 % — ABNORMAL LOW (ref 36.0–46.0)
Hemoglobin: 10.7 g/dL — ABNORMAL LOW (ref 12.0–15.0)
MCH: 25.8 pg — ABNORMAL LOW (ref 26.0–34.0)
MCHC: 31.6 g/dL (ref 30.0–36.0)
MCV: 81.7 fL (ref 80.0–100.0)
Platelets: 280 10*3/uL (ref 150–400)
RBC: 4.15 MIL/uL (ref 3.87–5.11)
RDW: 14.2 % (ref 11.5–15.5)
WBC: 8.7 10*3/uL (ref 4.0–10.5)
nRBC: 1 % — ABNORMAL HIGH (ref 0.0–0.2)

## 2022-03-12 LAB — URINALYSIS, ROUTINE W REFLEX MICROSCOPIC
Bilirubin Urine: NEGATIVE
Glucose, UA: NEGATIVE mg/dL
Ketones, ur: NEGATIVE mg/dL
Nitrite: NEGATIVE
Protein, ur: NEGATIVE mg/dL
Specific Gravity, Urine: 1.011 (ref 1.005–1.030)
pH: 6 (ref 5.0–8.0)

## 2022-03-12 LAB — COMPREHENSIVE METABOLIC PANEL
ALT: 56 U/L — ABNORMAL HIGH (ref 0–44)
AST: 64 U/L — ABNORMAL HIGH (ref 15–41)
Albumin: 2.4 g/dL — ABNORMAL LOW (ref 3.5–5.0)
Alkaline Phosphatase: 179 U/L — ABNORMAL HIGH (ref 38–126)
Anion gap: 11 (ref 5–15)
BUN: 15 mg/dL (ref 6–20)
CO2: 15 mmol/L — ABNORMAL LOW (ref 22–32)
Calcium: 8.5 mg/dL — ABNORMAL LOW (ref 8.9–10.3)
Chloride: 109 mmol/L (ref 98–111)
Creatinine, Ser: 1.14 mg/dL — ABNORMAL HIGH (ref 0.44–1.00)
GFR, Estimated: >60 mL/min (ref >60)
Glucose, Bld: 84 mg/dL (ref 70–99)
Potassium: 4.4 mmol/L (ref 3.5–5.1)
Sodium: 135 mmol/L (ref 135–145)
Total Bilirubin: 0.5 mg/dL (ref 0.3–1.2)
Total Protein: 6.5 g/dL (ref 6.5–8.1)

## 2022-03-12 LAB — TYPE AND SCREEN
ABO/RH(D): O POS
Antibody Screen: NEGATIVE

## 2022-03-12 LAB — POCT FERN TEST

## 2022-03-12 SURGERY — Surgical Case
Anesthesia: Spinal

## 2022-03-12 MED ORDER — DEXAMETHASONE SODIUM PHOSPHATE 10 MG/ML IJ SOLN
INTRAMUSCULAR | Status: AC
  Filled 2022-03-12: qty 1

## 2022-03-12 MED ORDER — TRANEXAMIC ACID-NACL 1000-0.7 MG/100ML-% IV SOLN
INTRAVENOUS | Status: AC
  Filled 2022-03-12: qty 100

## 2022-03-12 MED ORDER — DIPHENHYDRAMINE HCL 50 MG/ML IJ SOLN
INTRAMUSCULAR | Status: AC
  Filled 2022-03-12: qty 1

## 2022-03-12 MED ORDER — OXYCODONE HCL 5 MG PO TABS
5.0000 mg | ORAL_TABLET | ORAL | Status: DC | PRN
  Administered 2022-03-13: 5 mg via ORAL
  Administered 2022-03-13 – 2022-03-15 (×5): 10 mg via ORAL
  Filled 2022-03-12 (×4): qty 2
  Filled 2022-03-12: qty 1
  Filled 2022-03-12: qty 2

## 2022-03-12 MED ORDER — SOD CITRATE-CITRIC ACID 500-334 MG/5ML PO SOLN
30.0000 mL | ORAL | Status: AC
  Administered 2022-03-12: 30 mL via ORAL
  Filled 2022-03-12: qty 30

## 2022-03-12 MED ORDER — FENTANYL CITRATE (PF) 100 MCG/2ML IJ SOLN
INTRAMUSCULAR | Status: AC
  Filled 2022-03-12: qty 2

## 2022-03-12 MED ORDER — POLYETHYLENE GLYCOL 3350 17 G PO PACK
17.0000 g | PACK | Freq: Every day | ORAL | Status: DC
  Administered 2022-03-14 – 2022-03-15 (×2): 17 g via ORAL
  Filled 2022-03-12 (×2): qty 1

## 2022-03-12 MED ORDER — NIFEDIPINE 10 MG PO CAPS
10.0000 mg | ORAL_CAPSULE | Freq: Once | ORAL | Status: AC
  Administered 2022-03-12: 10 mg via ORAL
  Filled 2022-03-12: qty 1

## 2022-03-12 MED ORDER — ACETAMINOPHEN 500 MG PO TABS
1000.0000 mg | ORAL_TABLET | Freq: Four times a day (QID) | ORAL | Status: DC
  Administered 2022-03-12 – 2022-03-15 (×10): 1000 mg via ORAL
  Filled 2022-03-12 (×11): qty 2

## 2022-03-12 MED ORDER — CEFAZOLIN SODIUM-DEXTROSE 2-4 GM/100ML-% IV SOLN: 2.0000 g | INTRAVENOUS | Status: DC

## 2022-03-12 MED ORDER — SODIUM CHLORIDE 0.9 % IV SOLN
500.0000 mg | Freq: Once | INTRAVENOUS | Status: AC
  Administered 2022-03-12: 500 mg via INTRAVENOUS
  Filled 2022-03-12: qty 5

## 2022-03-12 MED ORDER — NALOXONE HCL 0.4 MG/ML IJ SOLN: 0.4000 mg | INTRAMUSCULAR | Status: DC | PRN

## 2022-03-12 MED ORDER — OXYTOCIN-SODIUM CHLORIDE 30-0.9 UT/500ML-% IV SOLN
INTRAVENOUS | Status: AC
  Filled 2022-03-12: qty 500

## 2022-03-12 MED ORDER — OXYCODONE HCL 5 MG/5ML PO SOLN: 5.0000 mg | Freq: Once | ORAL | Status: DC | PRN

## 2022-03-12 MED ORDER — COCONUT OIL OIL: 1.0000 | TOPICAL_OIL | Status: DC | PRN

## 2022-03-12 MED ORDER — OXYTOCIN-SODIUM CHLORIDE 30-0.9 UT/500ML-% IV SOLN
INTRAVENOUS | Status: DC | PRN
  Administered 2022-03-12: 30 [IU] via INTRAVENOUS

## 2022-03-12 MED ORDER — SODIUM CHLORIDE 0.9% FLUSH: 3.0000 mL | INTRAVENOUS | Status: DC | PRN

## 2022-03-12 MED ORDER — DEXMEDETOMIDINE HCL IN NACL 80 MCG/20ML IV SOLN
INTRAVENOUS | Status: DC | PRN
  Administered 2022-03-12: 8 ug via BUCCAL

## 2022-03-12 MED ORDER — MORPHINE SULFATE (PF) 0.5 MG/ML IJ SOLN
INTRAMUSCULAR | Status: AC
  Filled 2022-03-12: qty 10

## 2022-03-12 MED ORDER — WITCH HAZEL-GLYCERIN EX PADS: 1.0000 | MEDICATED_PAD | CUTANEOUS | Status: DC | PRN

## 2022-03-12 MED ORDER — TERBUTALINE SULFATE 1 MG/ML IJ SOLN
0.2500 mg | Freq: Once | INTRAMUSCULAR | Status: AC
  Administered 2022-03-12: 0.25 mg via SUBCUTANEOUS
  Filled 2022-03-12: qty 1

## 2022-03-12 MED ORDER — PROMETHAZINE HCL 25 MG/ML IJ SOLN: 6.2500 mg | INTRAMUSCULAR | Status: DC | PRN

## 2022-03-12 MED ORDER — HYDROMORPHONE HCL 1 MG/ML IJ SOLN: 0.2500 mg | INTRAMUSCULAR | Status: DC | PRN

## 2022-03-12 MED ORDER — PHENYLEPHRINE HCL-NACL 20-0.9 MG/250ML-% IV SOLN
INTRAVENOUS | Status: DC | PRN
  Administered 2022-03-12: 60 ug/min via INTRAVENOUS

## 2022-03-12 MED ORDER — SENNOSIDES-DOCUSATE SODIUM 8.6-50 MG PO TABS: 2.0000 | ORAL_TABLET | Freq: Every evening | ORAL | Status: DC | PRN

## 2022-03-12 MED ORDER — PRENATAL MULTIVITAMIN CH
1.0000 | ORAL_TABLET | Freq: Every day | ORAL | Status: DC
  Administered 2022-03-13 – 2022-03-14 (×2): 1 via ORAL
  Filled 2022-03-12 (×2): qty 1

## 2022-03-12 MED ORDER — SODIUM CHLORIDE 0.9 % IR SOLN
Status: DC | PRN
  Administered 2022-03-12: 1

## 2022-03-12 MED ORDER — ENOXAPARIN SODIUM 40 MG/0.4ML IJ SOSY
40.0000 mg | PREFILLED_SYRINGE | INTRAMUSCULAR | Status: DC
  Administered 2022-03-13 – 2022-03-14 (×2): 40 mg via SUBCUTANEOUS
  Filled 2022-03-12 (×2): qty 0.4

## 2022-03-12 MED ORDER — DIPHENHYDRAMINE HCL 50 MG/ML IJ SOLN
12.5000 mg | INTRAMUSCULAR | Status: DC | PRN
  Administered 2022-03-12 – 2022-03-13 (×2): 12.5 mg via INTRAVENOUS
  Filled 2022-03-12: qty 1

## 2022-03-12 MED ORDER — DIPHENHYDRAMINE HCL 25 MG PO CAPS: 25.0000 mg | ORAL_CAPSULE | Freq: Four times a day (QID) | ORAL | Status: DC | PRN

## 2022-03-12 MED ORDER — MORPHINE SULFATE (PF) 0.5 MG/ML IJ SOLN
INTRAMUSCULAR | Status: DC | PRN
  Administered 2022-03-12: 150 ug via INTRATHECAL

## 2022-03-12 MED ORDER — DIPHENHYDRAMINE HCL 25 MG PO CAPS: 25.0000 mg | ORAL_CAPSULE | ORAL | Status: DC | PRN

## 2022-03-12 MED ORDER — TRANEXAMIC ACID-NACL 1000-0.7 MG/100ML-% IV SOLN
INTRAVENOUS | Status: DC | PRN
  Administered 2022-03-12: 1000 mg via INTRAVENOUS

## 2022-03-12 MED ORDER — LACTATED RINGERS IV BOLUS
1000.0000 mL | Freq: Once | INTRAVENOUS | Status: AC
  Administered 2022-03-12: 1000 mL via INTRAVENOUS

## 2022-03-12 MED ORDER — STERILE WATER FOR IRRIGATION IR SOLN
Status: DC | PRN
  Administered 2022-03-12: 1

## 2022-03-12 MED ORDER — GABAPENTIN 100 MG PO CAPS
200.0000 mg | ORAL_CAPSULE | Freq: Two times a day (BID) | ORAL | Status: DC
  Administered 2022-03-13 – 2022-03-15 (×5): 200 mg via ORAL
  Filled 2022-03-12 (×5): qty 2

## 2022-03-12 MED ORDER — PHENYLEPHRINE HCL-NACL 20-0.9 MG/250ML-% IV SOLN
INTRAVENOUS | Status: AC
  Filled 2022-03-12: qty 250

## 2022-03-12 MED ORDER — POVIDONE-IODINE 10 % EX SWAB: 2.0000 | Freq: Once | CUTANEOUS | Status: DC

## 2022-03-12 MED ORDER — SIMETHICONE 80 MG PO CHEW
80.0000 mg | CHEWABLE_TABLET | Freq: Three times a day (TID) | ORAL | Status: DC
  Administered 2022-03-12 – 2022-03-15 (×8): 80 mg via ORAL
  Filled 2022-03-12 (×8): qty 1

## 2022-03-12 MED ORDER — PHENYLEPHRINE 80 MCG/ML (10ML) SYRINGE FOR IV PUSH (FOR BLOOD PRESSURE SUPPORT)
PREFILLED_SYRINGE | INTRAVENOUS | Status: DC | PRN
  Administered 2022-03-12: 80 ug via INTRAVENOUS

## 2022-03-12 MED ORDER — NALOXONE HCL 4 MG/10ML IJ SOLN: 1.0000 ug/kg/h | INTRAVENOUS | Status: DC | PRN

## 2022-03-12 MED ORDER — BUPIVACAINE IN DEXTROSE 0.75-8.25 % IT SOLN
INTRATHECAL | Status: DC | PRN
  Administered 2022-03-12: 1.6 mL via INTRATHECAL

## 2022-03-12 MED ORDER — DEXAMETHASONE SODIUM PHOSPHATE 10 MG/ML IJ SOLN
INTRAMUSCULAR | Status: DC | PRN
  Administered 2022-03-12: 10 mg via INTRAVENOUS

## 2022-03-12 MED ORDER — KETOROLAC TROMETHAMINE 30 MG/ML IJ SOLN
INTRAMUSCULAR | Status: AC
  Administered 2022-03-12: 30 mg via INTRAVENOUS
  Filled 2022-03-12: qty 1

## 2022-03-12 MED ORDER — LACTATED RINGERS IV SOLN: INTRAVENOUS | Status: DC

## 2022-03-12 MED ORDER — OXYCODONE HCL 5 MG PO TABS: 5.0000 mg | ORAL_TABLET | Freq: Once | ORAL | Status: DC | PRN

## 2022-03-12 MED ORDER — MENTHOL 3 MG MT LOZG: 1.0000 | LOZENGE | OROMUCOSAL | Status: DC | PRN

## 2022-03-12 MED ORDER — DIBUCAINE (PERIANAL) 1 % EX OINT: 1.0000 | TOPICAL_OINTMENT | CUTANEOUS | Status: DC | PRN

## 2022-03-12 MED ORDER — MEPERIDINE HCL 25 MG/ML IJ SOLN: 6.2500 mg | INTRAMUSCULAR | Status: DC | PRN

## 2022-03-12 MED ORDER — FENTANYL CITRATE (PF) 100 MCG/2ML IJ SOLN
INTRAMUSCULAR | Status: DC | PRN
  Administered 2022-03-12: 15 ug via INTRATHECAL

## 2022-03-12 MED ORDER — OXYTOCIN-SODIUM CHLORIDE 30-0.9 UT/500ML-% IV SOLN: 2.5000 [IU]/h | INTRAVENOUS | Status: AC

## 2022-03-12 MED ORDER — LACTATED RINGERS IV SOLN: INTRAVENOUS | Status: DC | PRN

## 2022-03-12 MED ORDER — ONDANSETRON HCL 4 MG/2ML IJ SOLN
INTRAMUSCULAR | Status: DC | PRN
  Administered 2022-03-12: 4 mg via INTRAVENOUS

## 2022-03-12 MED ORDER — KETOROLAC TROMETHAMINE 30 MG/ML IJ SOLN
30.0000 mg | Freq: Four times a day (QID) | INTRAMUSCULAR | Status: AC
  Administered 2022-03-13 (×3): 30 mg via INTRAVENOUS
  Filled 2022-03-12 (×3): qty 1

## 2022-03-12 MED ORDER — ONDANSETRON HCL 4 MG/2ML IJ SOLN
INTRAMUSCULAR | Status: AC
  Filled 2022-03-12: qty 2

## 2022-03-12 SURGICAL SUPPLY — 35 items
BENZOIN TINCTURE PRP APPL 2/3 (GAUZE/BANDAGES/DRESSINGS) IMPLANT
CHLORAPREP W/TINT 26 (MISCELLANEOUS) ×2 IMPLANT
CLAMP UMBILICAL CORD (MISCELLANEOUS) ×1 IMPLANT
CLOTH BEACON ORANGE TIMEOUT ST (SAFETY) ×1 IMPLANT
DRSG OPSITE POSTOP 4X10 (GAUZE/BANDAGES/DRESSINGS) ×1 IMPLANT
ELECT REM PT RETURN 9FT ADLT (ELECTROSURGICAL) ×1
ELECTRODE REM PT RTRN 9FT ADLT (ELECTROSURGICAL) ×1 IMPLANT
EXTRACTOR VACUUM BELL STYLE (SUCTIONS) IMPLANT
GAUZE SPONGE 4X4 12PLY STRL LF (GAUZE/BANDAGES/DRESSINGS) IMPLANT
GLOVE BIOGEL PI IND STRL 7.0 (GLOVE) ×2 IMPLANT
GLOVE ECLIPSE 7.0 STRL STRAW (GLOVE) ×1 IMPLANT
GOWN STRL REUS W/TWL LRG LVL3 (GOWN DISPOSABLE) ×2 IMPLANT
KIT ABG SYR 3ML LUER SLIP (SYRINGE) ×1 IMPLANT
NDL HYPO 25X5/8 SAFETYGLIDE (NEEDLE) ×1 IMPLANT
NEEDLE HYPO 25X5/8 SAFETYGLIDE (NEEDLE) ×1 IMPLANT
NS IRRIG 1000ML POUR BTL (IV SOLUTION) ×1 IMPLANT
PACK C SECTION WH (CUSTOM PROCEDURE TRAY) ×1 IMPLANT
PAD ABD 7.5X8 STRL (GAUZE/BANDAGES/DRESSINGS) IMPLANT
PAD OB MATERNITY 4.3X12.25 (PERSONAL CARE ITEMS) ×1 IMPLANT
RTRCTR C-SECT PINK 25CM LRG (MISCELLANEOUS) ×1 IMPLANT
STRIP CLOSURE SKIN 1/2X4 (GAUZE/BANDAGES/DRESSINGS) IMPLANT
SUT MNCRL 0 VIOLET CTX 36 (SUTURE) ×2 IMPLANT
SUT MONOCRYL 0 CTX 36 (SUTURE) ×2
SUT PLAIN 0 NONE (SUTURE) IMPLANT
SUT PLAIN 2 0 (SUTURE)
SUT PLAIN 2 0 XLH (SUTURE) IMPLANT
SUT PLAIN ABS 2-0 CT1 27XMFL (SUTURE) IMPLANT
SUT VIC AB 0 CTX 36 (SUTURE) ×1
SUT VIC AB 0 CTX36XBRD ANBCTRL (SUTURE) ×1 IMPLANT
SUT VIC AB 2-0 CT1 27 (SUTURE)
SUT VIC AB 2-0 CT1 TAPERPNT 27 (SUTURE) IMPLANT
SUT VIC AB 4-0 KS 27 (SUTURE) ×1 IMPLANT
TOWEL OR 17X24 6PK STRL BLUE (TOWEL DISPOSABLE) ×1 IMPLANT
TRAY FOLEY W/BAG SLVR 14FR LF (SET/KITS/TRAYS/PACK) IMPLANT
WATER STERILE IRR 1000ML POUR (IV SOLUTION) ×1 IMPLANT

## 2022-03-12 NOTE — Discharge Summary (Signed)
Postpartum Discharge Summary  Date of Service updated***     Patient Name: Kimberly Poole DOB: 1986-01-04 MRN: [id number]  Date of admission: 03/12/2022 Delivery date:03/12/2022  Delivering provider: Aletha Halim  Date of discharge: 03/12/2022  Admitting diagnosis: Pregnancy at 35/6 Secondary diagnosis:  Painful contractions, ?early labor and a history of a prior cesarean section  Additional problems: ***    Discharge diagnosis: {DX.:23714}                                              Post partum procedures:{Postpartum procedures:23558} Augmentation: N/A Complications: uterine window at c-section  Hospital course: Onset of Labor With Unplanned C/S   36 y.o. yo G2P1102 [facility]. She had painful uterine contractions unresponsive to IVF bolus, procardia and terbutaline. Cervix stable at 1/90/+1 and q2-341montractions. Given how uncomfortable patient was and her desire to not TOLAC she underwent a rpt c-section and BTL with a uterine window noted. Delivery details as follows: Membrane Rupture Time/Date: 4:01 PM ,03/12/2022   Delivery Method:C-Section, Low Transverse  Details of operation can be found in separate operative note. Patient had a postpartum course complicated by***.  She is ambulating,tolerating a regular diet, passing flatus, and urinating well.  Patient is discharged home in stable condition 03/12/22.  Newborn Data: Birth date:03/12/2022  Birth time:4:02 PM  Gender:Female  Living status:Living  Apgars:8 ,9  Weight:2500 g   Magnesium Sulfate received: {Mag received:30440022} BMZ received: {BMZ received:30440023} Rhophylac:{Rhophylac received:30440032} MMDGL:{OVF:64332951}-DaP:{Tdap:23962} Flu: {F{OAC:16606}ransfusion:{Transfusion received:30440034}  Physical exam  Vitals:   03/12/22 1131  BP: 120/86  Pulse: 100  Resp: 20  Temp: 98 F (36.7 C)  TempSrc: Oral  SpO2: 100%  Weight: 77.2 kg   Height: _0  (1.549 m)   General: {Exam; general:21111117} Lochia: {Desc; appropriate/inappropriate:30686::"appropriate"} Uterine Fundus: {Desc; firm/soft:30687} Incision: {Exam; incision:21111123} DVT Evaluation: {Exam; dvTKZ:6010932}abs: Lab Results  Component Value Date   WBC 8.7 03/12/2022   HGB 10.7 (L) 03/12/2022   HCT 33.9 (L) 03/12/2022   MCV 81.7 03/12/2022   PLT 280 03/12/2022      Latest Ref Rng & Units 03/12/2022   12:21 PM  CMP  Glucose 70 - 99 mg/dL 84   BUN 6 - 20 mg/dL 15   Creatinine 0.44 - 1.00 mg/dL 1.14   Sodium 135 - 145 mmol/L 135   Potassium 3.5 - 5.1 mmol/L 4.4   Chloride 98 - 111 mmol/L 109   CO2 22 - 32 mmol/L 15   Calcium 8.9 - 10.3 mg/dL 8.5   Total Protein 6.5 - 8.1 g/dL 6.5   Total Bilirubin 0.3 - 1.2 mg/dL 0.5   Alkaline Phos 38 - 126 U/L 179   AST 15 - 41 U/L 64   ALT 0 - 44 U/L 56    Edinburgh Score:     No data to display            After visit meds:  Allergies as of 03/12/2022       Reactions   Diclofenac Itching   Other Itching, Swelling   Nuts-causes itching tongue and swelling of mouth     Med Rec must be completed [facility]        Discharge home in stable condition Infant Feeding: {Baby feeding:23562} Infant Disposition:{CHL IP OB HOME WITH MOTFTDDU:20254}  Discharge instruction: per After Visit Summary and Postpartum booklet. Activity: Advance as tolerated. Pelvic rest for 6 weeks.  Diet: {OB diet:21111121} Anticipated Birth Control: {Birth Control:23956} Postpartum Appointment:{Outpatient follow up:23559} Additional Postpartum F/U: {PP Procedure:23957} Future Appointments: Future Appointments  Date Time Provider [place]  03/16/2022 10:30 AM [facility] NURSE [facility]  03/16/2022 10:45 AM [facility] US4 [facility]  03/21/2022  1:15 PM Griffin Basil, MD [facility]  03/28/2022  8:15 AM Clarnce Flock, MD [facility]  04/04/2022  8:15 AM Clarnce Flock, MD [facility]  [facility]  04/12/2022  8:55 AM Woodroe Mode, MD [facility]  04/12/2022 11:15 AM [facility] NST [facility] [place]   Follow up Visit:      03/12/2022 Aletha Halim, MD

## 2022-03-12 NOTE — Transfer of Care (Signed)
Immediate Anesthesia Transfer of Care Note  Patient: Kimberly Poole  Procedure(s) Performed: CESAREAN SECTION WITH BILATERAL TUBAL LIGATION  Patient Location: PACU  Anesthesia Type:Spinal  Level of Consciousness: awake, alert , and oriented  Airway & Oxygen Therapy: Patient Spontanous Breathing  Post-op Assessment: Report given to RN and Post -op Vital signs reviewed and stable  Post vital signs: Reviewed and stable  Last Vitals:  Vitals Value Taken Time  BP 104/82 03/12/22 1703  Temp    Pulse 104 03/12/22 1709  Resp 21 03/12/22 1709  SpO2 100 % 03/12/22 1709  Vitals shown include unvalidated device data.  Last Pain:  Vitals:   03/12/22 1334  TempSrc:   PainSc: 10-Worst pain ever      Patients Stated Pain Goal: 0 (03/12/22 1135)  Complications: No notable events documented.

## 2022-03-12 NOTE — H&P (Signed)
LABOR AND DELIVERY ADMISSION HISTORY AND PHYSICAL NOTE  Kimberly Poole is a 36 y.o. female G2P1001 with IUP at [redacted]w[redacted]d presenting for contractions. She has been having persistent contractions causing lower abdominal and back pain, for the last 2 days. Now worsening. She is unable to get comfortable. She has a history of 1 prior C-section due to failure to progress and has been scheduled for a repeat C-section.  Patient reports the fetal movement as active. Patient reports uterine contraction  activity as consistent. Patient reports  vaginal bleeding as scant, with bloody show Patient describes fluid per vagina as None.   Patient denies headache, vision changes, chest pain, shortness of breath, right upper quadrant pain, or LE edema.  She plans on breast feeding and bottle feeding feeding. Her contraception plan is: bilateral tubal ligation.  Prenatal History/Complications: PNC at [facility]  Sono:  @[redacted]w[redacted]d , CWD, normal anatomy, cephalic presentation, anterior placenta, 74%ile, EFW 2176  Pregnancy complications:  Patient Active Problem List   Diagnosis Date Noted   Preterm uterine contractions, antepartum 02/12/2022   Previous cesarean delivery affecting pregnancy 11/22/2021   Nuchal fold thickening on prenatal ultrasound 11/22/2021   Supervision of high risk pregnancy, antepartum 09/29/2021   AMA (advanced maternal age) multigravida 35+ 09/29/2021   History of drug overdose 09/29/2021   History of drug use 09/29/2021   Migraines    Depression    Language barrier, cultural differences 09/16/2021    Past Medical History: Past Medical History:  Diagnosis Date   Anxiety    Depression    Gallstones    Hypotension    Kidney stones    Migraines     Past Surgical History: Past Surgical History:  Procedure Laterality Date   CESAREAN SECTION      Obstetrical History: OB History     Gravida  2   Para  1   Term  1   Preterm      AB      Living  1       SAB      IAB      Ectopic      Multiple      Live Births  1           Social History: Social History   Socioeconomic History   Marital status: Married    Spouse name: Not on file   Number of children: Not on file   Years of education: Not on file   Highest education level: Not on file  Occupational History   Not on file  Tobacco Use   Smoking status: Former    Types: Cigarettes    Quit date: 2020    Years since quitting: 3.9   Smokeless tobacco: Never  Vaping Use   Vaping Use: Never used  Substance and Sexual Activity   Alcohol use: Not Currently    Comment: celebrations, stopped drinking alcohol 2022   Drug use: Not Currently    Types: Cocaine    Comment: last used 03/20/2017   Sexual activity: Not Currently    Birth control/protection: None  Other Topics Concern   Not on file  Social History Narrative   ** Merged History Encounter **       Social Determinants of Health   Financial Resource Strain: Not on file  Food Insecurity: Not on file  Transportation Needs: Not on file  Physical Activity: Not on file  Stress: Not on file  Social Connections: Not on file    Family  History: Family History  Problem Relation Age of Onset   Lung disease Mother    Lung disease Father     Allergies: Allergies  Allergen Reactions   Diclofenac Itching   Other Itching and Swelling    Nuts-causes itching tongue and swelling of mouth    Medications Prior to Admission  Medication Sig Dispense Refill Last Dose   acetaminophen (TYLENOL) 650 MG CR tablet Take 650 mg by mouth every 8 (eight) hours as needed for pain.   Past Week   cyclobenzaprine (FLEXERIL) 10 MG tablet Take 1 tablet (10 mg total) by mouth 2 (two) times daily as needed for muscle spasms. 20 tablet 1 03/12/2022   hydrOXYzine (ATARAX) 25 MG tablet Take 1 tablet (25 mg total) by mouth daily with breakfast. 30 tablet 5 03/11/2022   Prenatal Vit-Fe Fumarate-FA (PREPLUS) 27-1 MG TABS Take 1 tablet by mouth  daily. 30 tablet 13 03/12/2022   Ascorbic Acid (VITAMIN C PO) Take by mouth. (Patient not taking: Reported on 03/12/2022)   Not Taking   NIFEdipine (PROCARDIA) 10 MG capsule Take 1 capsule (10 mg total) by mouth every 6 (six) hours as needed (preterm contractions). (Patient not taking: Reported on 03/12/2022) 24 capsule 0 Not Taking   pantoprazole (PROTONIX) 20 MG tablet Take 1 tablet (20 mg total) by mouth 2 (two) times daily before a meal. (Patient not taking: Reported on 03/12/2022) 60 tablet 5 Not Taking    Review of Systems  All systems reviewed and negative except as stated in HPI  Physical Exam BP 120/86 (BP Location: Right Arm)   Pulse 100   Temp 98 F (36.7 C) (Oral)   Resp 20   Ht 5\' 1"  (1.549 m)   Wt 77.2 kg   LMP 06/18/2021   SpO2 100%   BMI 32.16 kg/m   Physical Exam Constitutional:      General: She is not in acute distress.    Appearance: Normal appearance. She is not toxic-appearing.     Comments: Appears very uncomfortable due to pain from contractions.  HENT:     Head: Normocephalic and atraumatic.     Nose: Nose normal.     Mouth/Throat:     Mouth: Mucous membranes are moist.  Eyes:     Extraocular Movements: Extraocular movements intact.     Conjunctiva/sclera: Conjunctivae normal.  Cardiovascular:     Rate and Rhythm: Normal rate.     Pulses: Normal pulses.  Pulmonary:     Effort: Pulmonary effort is normal. No respiratory distress.  Abdominal:     Tenderness: There is no abdominal tenderness.     Comments: Gravid  Skin:    General: Skin is warm and dry.     Capillary Refill: Capillary refill takes less than 2 seconds.  Neurological:     General: No focal deficit present.     Mental Status: She is alert.  Psychiatric:        Mood and Affect: Mood normal.        Behavior: Behavior normal.    Cervix: Dilation: 1 Effacement (%): 90 Cervical Position: Posterior Station: Plus 1 Presentation: Vertex Exam by:: Dr. Trina Ao  EFM:  Baseline:  135bpm, moderate variability, +accels, no decels Contractions: painful, moderate every 2-3 mins  Prenatal labs: ABO, Rh: --/--/O POS (12/24 1250) Antibody: NEG (12/24 1250) Rubella: 7.41 (09/05 1150) RPR: Non Reactive (12/06 1027)  HBsAg: Negative (09/05 1150)  HIV: Non Reactive (12/06 1027)  GC/Chlamydia:  Neisseria Gonorrhea  Date Value Ref Range  Status  02/11/2022 Negative  Final   Chlamydia  Date Value Ref Range Status  02/11/2022 Negative  Final   GBS:  unknown  Prenatal Transfer Tool  Maternal Diabetes: No Genetic Screening: Declined Maternal Ultrasounds/Referrals: thickened nuchal fold; genetic testing was declined. Per MFM noted, Patient would like to wait until after delivery to know if baby possibly has down syndrome. Fetal Ultrasounds or other Referrals:  Fetal echo - normal Maternal Substance Abuse:  No Significant Maternal Medications:  None Significant Maternal Lab Results: Other:  GBS unknown  Results for orders placed or performed during the hospital encounter of 03/12/22 (from the past 24 hour(s))  Urinalysis, Routine w reflex microscopic Urine, Clean Catch   Collection Time: 03/12/22 12:01 PM  Result Value Ref Range   Color, Urine YELLOW YELLOW   APPearance HAZY (A) CLEAR   Specific Gravity, Urine 1.011 1.005 - 1.030   pH 6.0 5.0 - 8.0   Glucose, UA NEGATIVE NEGATIVE mg/dL   Hgb urine dipstick MODERATE (A) NEGATIVE   Bilirubin Urine NEGATIVE NEGATIVE   Ketones, ur NEGATIVE NEGATIVE mg/dL   Protein, ur NEGATIVE NEGATIVE mg/dL   Nitrite NEGATIVE NEGATIVE   Leukocytes,Ua SMALL (A) NEGATIVE   RBC / HPF 11-20 0 - 5 RBC/hpf   WBC, UA 11-20 0 - 5 WBC/hpf   Bacteria, UA FEW (A) NONE SEEN   Squamous Epithelial / LPF 11-20 0 - 5  Fern Test   Collection Time: 03/12/22 12:15 PM  Result Value Ref Range   POCT Fern Test    CBC   Collection Time: 03/12/22 12:21 PM  Result Value Ref Range   WBC 8.7 4.0 - 10.5 K/uL   RBC 4.15 3.87 - 5.11 MIL/uL   Hemoglobin  10.7 (L) 12.0 - 15.0 g/dL   HCT 33.9 (L) 36.0 - 46.0 %   MCV 81.7 80.0 - 100.0 fL   MCH 25.8 (L) 26.0 - 34.0 pg   MCHC 31.6 30.0 - 36.0 g/dL   RDW 14.2 11.5 - 15.5 %   Platelets 280 150 - 400 K/uL   nRBC 1.0 (H) 0.0 - 0.2 %  Comprehensive metabolic panel   Collection Time: 03/12/22 12:21 PM  Result Value Ref Range   Sodium 135 135 - 145 mmol/L   Potassium 4.4 3.5 - 5.1 mmol/L   Chloride 109 98 - 111 mmol/L   CO2 15 (L) 22 - 32 mmol/L   Glucose, Bld 84 70 - 99 mg/dL   BUN 15 6 - 20 mg/dL   Creatinine, Ser 1.14 (H) 0.44 - 1.00 mg/dL   Calcium 8.5 (L) 8.9 - 10.3 mg/dL   Total Protein 6.5 6.5 - 8.1 g/dL   Albumin 2.4 (L) 3.5 - 5.0 g/dL   AST 64 (H) 15 - 41 U/L   ALT 56 (H) 0 - 44 U/L   Alkaline Phosphatase 179 (H) 38 - 126 U/L   Total Bilirubin 0.5 0.3 - 1.2 mg/dL   GFR, Estimated >60 >60 mL/min   Anion gap 11 5 - 15  Type and screen [place]   Collection Time: 03/12/22 12:50 PM  Result Value Ref Range   ABO/RH(D) O POS    Antibody Screen NEG    Sample Expiration      03/15/2022,2359 Performed at [facility], [address], [place], [place] [address]     Assessment: Kimberly Poole is a 36 y.o. G2P1001 at [redacted]w[redacted]d here for preterm labor, is very uncomfortable and no improvement with  tocolytics - procardia and terbutaline.  She will be admitted for unscheduled, emergent repeat  low transverse cesarean section, due to preterm labor  Other pregnancy concerns:  AMA Thickened fetal nuchal fold on Ultrasound. Declined genetic screening.  #Repeat Low Transverse Cesarean  The risks of cesarean section were discussed with the patient including but were not limited to: bleeding which may require transfusion or reoperation; infection which may require antibiotics; injury to bowel, bladder, ureters or other surrounding organs; injury to the fetus; need for additional procedures including hysterectomy in the event of a life-threatening  hemorrhage; placental abnormalities wth subsequent pregnancies, incisional problems, thromboembolic phenomenon and other postoperative/anesthesia complications.  Patient also desires permanent sterilization.  Other reversible forms of contraception were discussed with patient; she declines all other modalities. Risks of procedure discussed with patient including but not limited to: risk of regret, permanence of method, bleeding, infection, injury to surrounding organs and need for additional procedures.  Failure risk of about 1% with increased risk of ectopic gestation if pregnancy occurs was also discussed with patient.  Also discussed possibility of post-tubal pain syndrome. The patient concurred with the proposed plan, giving informed written consent for the procedures.  Patient has been NPO for >6hrs.. Anesthesia and OR aware.  Preoperative prophylactic antibiotics and SCDs ordered on call to the OR.   Risks discussed with patient and consent signed by Dr Ilda Basset.  #Anesthesia: spinal #FWB: Cat 1 #GBS/ID: Negative #MOF: breast feeding and bottle feeding #MOC: bilateral tubal ligation #Circ: No  Liliane Channel MD MPH OB Fellow, [place] [facility] [place] 03/12/2022    03/12/2022, 2:43 PM

## 2022-03-12 NOTE — MAU Note (Signed)
..  Kimberly Poole is a 36 y.o. at [redacted]w[redacted]d here in MAU reporting: contraction for two days with leaking of fluid. Pain 10/10 in hips, back and lower abdomen.  Onset of complaint: 03/10/2022 Pain score: 10/10 Vitals:   03/12/22 1131  BP: 120/86  Pulse: 100  Resp: 20  Temp: 98 F (36.7 C)  SpO2: 100%     FHT:160 Lab orders placed from triage: UA

## 2022-03-12 NOTE — Anesthesia Postprocedure Evaluation (Signed)
Anesthesia Post Note  Patient: Kimberly Poole  Procedure(s) Performed: CESAREAN SECTION WITH BILATERAL TUBAL LIGATION     Patient location during evaluation: PACU Anesthesia Type: Spinal Level of consciousness: awake and alert Pain management: pain level controlled Vital Signs Assessment: post-procedure vital signs reviewed and stable Respiratory status: spontaneous breathing, nonlabored ventilation and respiratory function stable Cardiovascular status: blood pressure returned to baseline and stable Postop Assessment: no apparent nausea or vomiting Anesthetic complications: no   No notable events documented.  Last Vitals:  Vitals:   03/12/22 1800 03/12/22 1815  BP: 102/78 108/83  Pulse: 89 76  Resp:    Temp:    SpO2: 100% 100%    Last Pain:  Vitals:   03/12/22 1815  TempSrc:   PainSc: 3    Pain Goal: Patients Stated Pain Goal: 0 (03/12/22 1135)  LLE Motor Response: Purposeful movement (03/12/22 1800) LLE Sensation: Tingling (03/12/22 1800) RLE Motor Response: Purposeful movement (03/12/22 1800) RLE Sensation: Tingling (03/12/22 1800)     Epidural/Spinal Function Cutaneous sensation: Able to Wiggle Toes (03/12/22 1800), Patient able to flex knees: No (03/12/22 1800), Patient able to lift hips off bed: No (03/12/22 1800), Back pain beyond tenderness at insertion site: No (03/12/22 1800), Progressively worsening motor and/or sensory loss: No (03/12/22 1800), Bowel and/or bladder incontinence post epidural: No (03/12/22 1800)  Lowella Curb

## 2022-03-12 NOTE — Anesthesia Preprocedure Evaluation (Signed)
Anesthesia Evaluation  Patient identified by MRN, date of birth, ID band Patient awake    Reviewed: Allergy & Precautions, H&P , NPO status , Patient's Chart, lab work & pertinent test results  Airway Mallampati: II  TM Distance: >3 FB Neck ROM: Full    Dental no notable dental hx.    Pulmonary neg pulmonary ROS, former smoker   Pulmonary exam normal breath sounds clear to auscultation       Cardiovascular negative cardio ROS Normal cardiovascular exam Rhythm:Regular Rate:Normal     Neuro/Psych  Headaches  Anxiety Depression     negative psych ROS   GI/Hepatic negative GI ROS, Neg liver ROS,,,  Endo/Other  negative endocrine ROS    Renal/GU negative Renal ROS  negative genitourinary   Musculoskeletal negative musculoskeletal ROS (+)    Abdominal   Peds negative pediatric ROS (+)  Hematology negative hematology ROS (+)   Anesthesia Other Findings   Reproductive/Obstetrics (+) Pregnancy                             Anesthesia Physical Anesthesia Plan  ASA: 2 and emergent  Anesthesia Plan: Spinal   Post-op Pain Management:    Induction:   PONV Risk Score and Plan: 2 and Treatment may vary due to age or medical condition  Airway Management Planned: Natural Airway  Additional Equipment:   Intra-op Plan:   Post-operative Plan:   Informed Consent: I have reviewed the patients History and Physical, chart, labs and discussed the procedure including the risks, benefits and alternatives for the proposed anesthesia with the patient or authorized representative who has indicated his/her understanding and acceptance.     Dental advisory given  Plan Discussed with: CRNA  Anesthesia Plan Comments:        Anesthesia Quick Evaluation

## 2022-03-12 NOTE — Anesthesia Procedure Notes (Signed)
Spinal  Patient location during procedure: OB Start time: 03/12/2022 3:29 PM End time: 03/12/2022 3:34 PM Reason for block: surgical anesthesia Staffing Performed: anesthesiologist  Anesthesiologist: Lowella Curb, MD Performed by: Lowella Curb, MD Authorized by: Lowella Curb, MD   Preanesthetic Checklist Completed: patient identified, IV checked, risks and benefits discussed, surgical consent, monitors and equipment checked, pre-op evaluation and timeout performed Spinal Block Patient position: sitting Prep: DuraPrep and site prepped and draped Patient monitoring: heart rate, cardiac monitor, continuous pulse ox and blood pressure Approach: midline Location: L3-4 Injection technique: single-shot Needle Needle type: Pencan  Needle gauge: 24 G Needle length: 10 cm Assessment Sensory level: T4 Events: CSF return

## 2022-03-12 NOTE — Progress Notes (Addendum)
MAU Note I went to see the patient and she has had procardia, IVF bolus and terbutaline and still endorsing worsening painful contractions q51m. She also said she had some LOF (fern negative) and some BRB/spotting (cervix exam unchanged at 1/90/+1). Baby is category I with accels and mom's vitals are fine.  In speaking to her, she is painfully contracting and moving around in the bed and breathing through them when she has them about q2-37m. Abdomen is nttp and heart and lung exam are benign  I told her that since she is contracting so painfully at 35/6 I don't feel any other measures to keep her pregnant is indicated and with a risk of uterine rupture since she desires rpt c/s; she received bmz on 11/26 and 11/27 when she was inpatient for preterm contractions. I told her the baby would be preterm and likely have to be in the SCN but I feel it's safest to deliver now which her and her partner are comfortable with. Risk of surgery and BTL d/w her including infection, need for transfusion, damage to surrounding structures and permnanecy of procedure d/w her and she is amenable to proceeding  She had [employer] at 0900. I d/w Anesthesia and feel it's best to proceed without waiting the 8 hours.   In person interpreter used   Bing, Montez Hageman MD Attending Center for [employer] (Faculty Practice) 03/12/2022 Time: [phone]

## 2022-03-12 NOTE — Plan of Care (Signed)
  Problem: Education: Goal: Knowledge of disease or condition will improve Outcome: Progressing Goal: Knowledge of the prescribed therapeutic regimen will improve Outcome: Progressing Goal: Individualized Educational Video(s) Outcome: Progressing   Problem: Clinical Measurements: Goal: Complications related to the disease process, condition or treatment will be avoided or minimized Outcome: Progressing   Problem: Education: Goal: Knowledge of General Education information will improve Description: Including pain rating scale, medication(s)/side effects and non-pharmacologic comfort measures Outcome: Progressing   Problem: Health Behavior/Discharge Planning: Goal: Ability to manage health-related needs will improve Outcome: Progressing   Problem: Clinical Measurements: Goal: Ability to maintain clinical measurements within normal limits will improve Outcome: Progressing Goal: Will remain free from infection Outcome: Progressing Goal: Diagnostic test results will improve Outcome: Progressing Goal: Respiratory complications will improve Outcome: Progressing Goal: Cardiovascular complication will be avoided Outcome: Progressing   Problem: Activity: Goal: Risk for activity intolerance will decrease Outcome: Progressing   Problem: Nutrition: Goal: Adequate nutrition will be maintained Outcome: Progressing   Problem: Coping: Goal: Level of anxiety will decrease Outcome: Progressing   Problem: Elimination: Goal: Will not experience complications related to bowel motility Outcome: Progressing Goal: Will not experience complications related to urinary retention Outcome: Progressing   Problem: Pain Managment: Goal: General experience of comfort will improve Outcome: Progressing   Problem: Safety: Goal: Ability to remain free from injury will improve Outcome: Progressing   Problem: Skin Integrity: Goal: Risk for impaired skin integrity will decrease Outcome:  Progressing   Problem: Education: Goal: Knowledge of the prescribed therapeutic regimen will improve Outcome: Progressing Goal: Understanding of sexual limitations or changes related to disease process or condition will improve Outcome: Progressing Goal: Individualized Educational Video(s) Outcome: Progressing   Problem: Self-Concept: Goal: Communication of feelings regarding changes in body function or appearance will improve Outcome: Progressing   Problem: Skin Integrity: Goal: Demonstration of wound healing without infection will improve Outcome: Progressing   Problem: Education: Goal: Knowledge of condition will improve Outcome: Progressing Goal: Individualized Educational Video(s) Outcome: Progressing Goal: Individualized Newborn Educational Video(s) Outcome: Progressing   Problem: Activity: Goal: Will verbalize the importance of balancing activity with adequate rest periods Outcome: Progressing Goal: Ability to tolerate increased activity will improve Outcome: Progressing   Problem: Coping: Goal: Ability to identify and utilize available resources and services will improve Outcome: Progressing   Problem: Life Cycle: Goal: Chance of risk for complications during the postpartum period will decrease Outcome: Progressing   Problem: Role Relationship: Goal: Ability to demonstrate positive interaction with newborn will improve Outcome: Progressing   Problem: Skin Integrity: Goal: Demonstration of wound healing without infection will improve Outcome: Progressing

## 2022-03-12 NOTE — Op Note (Signed)
Operative Note   SURGERY DATE: 03/12/2022  PRE-OP DIAGNOSIS:  *Pregnancy at 35/6 *Painful preterm contractions and 1cm/90 effacement *History of cesarean *Desire for permanent sterilization  POST-OP DIAGNOSIS: Same. Uterine window   PROCEDURE: Repeat low transverse cesarean section via pfannenstiel skin incision with double layer uterine closure and bilateral tubal ligation via modified Parkland technique  SURGEON: Surgeon(s) and Role:    * [place] Bing, MD - Primary  ASSISTANT:    * Mercado-Ortiz, Lahoma Crocker, DO - Fellow  An experienced assistant was required given the standard of surgical care given the complexity of the case.  This assistant was needed for exposure, dissection, suctioning, retraction, instrument exchange, assisting with delivery with administration of fundal pressure, and for overall help during the procedure.  ANESTHESIA: spinal  ESTIMATED BLOOD LOSS:   DRAINS: UOP via indwelling foley  TOTAL IV FLUIDS: per anesthesia note  VTE PROPHYLAXIS: SCDs to bilateral lower extremities  ANTIBIOTICS: Two grams of Cefazolin and Azithromycin 500mg  IV x 1 were given., within 1 hour of skin incision  SPECIMENS: none  COMPLICATIONS: right hysterotomy extension  FINDINGS: No intra-abdominal adhesions were noted except for some filmy adhesions at the bladder flap. Grossly normal uterus, tubes and ovaries. In the area of the lower uterine segment, there was a hematoma that was forming along the entire width of that area and measuring 1cm in width. This area was very thin and when punctured, the placenta was directly underneath it. Clear amniotic fluid, cephalic, female infant, weight 2500gm, APGARs 8/9, intact placenta.  PROCEDURE IN DETAIL: The patient was taken to the operating room where anesthesia was administered and normal fetal heart tones were confirmed. She was then prepped and draped in the normal fashion in the dorsal supine position with a leftward  tilt.  After a time out was performed, a incision was made with the scalpel.  The rectus muscles were then separated in the midline and the peritoneum was entered bluntly. The bladder blade was inserted and the vesicouterine peritoneum was identified, tented up and entered with the metzenbaum scissors. This incision was extended laterally and the bladder flap was created digitally. The bladder blade was reinserted.  A low transverse hysterotomy was made with the scalpel until the endometrial cavity was breached and the amniotic sac ruptured with the Allis clamp, yielding clear amniotic fluid. This incision was extended bluntly and the infant's head, shoulders and body were delivered atraumatically.The cord was clamped x 2 and cut, and the infant was handed to the awaiting pediatricians, after delayed cord clamping was not done due to the placenta at the incision.  The placenta was then gradually expressed from the uterus and then the uterus was exteriorized and cleared of all clots and debris. The hysterotomy was repaired with a running locking suture of 1-0 monocryl to achieve excellent hemostasis being sure to get to the lateral edge of the right hysterotomy  The left Fallopian tube was identified by tracing out to the fimbraie, grasped with the Babcock clamps. An avascular midsection of the tube approximately 3-4cm from the cornua was grasped with the babcock clamps and the distal and proximal aspects were ligated with a suture of 1-0 vicryl, with the intervening portion of tube was transected and removed, via the Metzenbaum scissors. Another suture tie was then placed below both stumps.  Attention was then turned to the right fallopian tube after confirmation by tracing the tube out to the fimbriae. The same procedure was then performed on the right  Fallopian tube, with excellent hemostasis was noted from both BTL sites.   The uterus and adnexa were then returned to the abdomen, and the  hysterotomy and all operative sites were reinspected and excellent hemostasis was noted after irrigation and suction of the abdomen with warm saline.  The peritoneum was closed with a running stitch of 3-0 Vicryl. The fascia was reapproximated with 0 Vicryl in a simple running fashion bilaterally. The subcutaneous layer was then reapproximated with interrupted sutures of 2-0 plain gut, and the skin was then closed with 4-0 monocryl, in a subcuticular fashion.  The patient  tolerated the procedure well. Sponge, lap, needle, and instrument counts were correct x 2. The patient was transferred to the recovery room awake, alert and breathing independently in stable condition.  Cornelia Copa MD Attending Center for [facility])

## 2022-03-13 ENCOUNTER — Encounter (HOSPITAL_COMMUNITY): Payer: Self-pay | Admitting: Obstetrics and Gynecology

## 2022-03-13 DIAGNOSIS — Z9851 Tubal ligation status: Secondary | ICD-10-CM

## 2022-03-13 LAB — CBC
HCT: 26.5 % — ABNORMAL LOW (ref 36.0–46.0)
Hemoglobin: 8.8 g/dL — ABNORMAL LOW (ref 12.0–15.0)
MCH: 25.9 pg — ABNORMAL LOW (ref 26.0–34.0)
MCHC: 33.2 g/dL (ref 30.0–36.0)
MCV: 77.9 fL — ABNORMAL LOW (ref 80.0–100.0)
Platelets: 235 10*3/uL (ref 150–400)
RBC: 3.4 MIL/uL — ABNORMAL LOW (ref 3.87–5.11)
RDW: 13.9 % (ref 11.5–15.5)
WBC: 14.2 10*3/uL — ABNORMAL HIGH (ref 4.0–10.5)
nRBC: 0.4 % — ABNORMAL HIGH (ref 0.0–0.2)

## 2022-03-13 LAB — CREATININE, SERUM
Creatinine, Ser: 1 mg/dL (ref 0.44–1.00)
GFR, Estimated: >60 mL/min (ref >60)

## 2022-03-13 MED ORDER — SODIUM CHLORIDE 0.9 % IV SOLN
500.0000 mg | Freq: Once | INTRAVENOUS | Status: AC
  Administered 2022-03-13: 500 mg via INTRAVENOUS
  Filled 2022-03-13: qty 500

## 2022-03-13 MED ORDER — CITALOPRAM HYDROBROMIDE 20 MG PO TABS
40.0000 mg | ORAL_TABLET | Freq: Every day | ORAL | Status: DC
  Administered 2022-03-13 – 2022-03-15 (×3): 40 mg via ORAL
  Filled 2022-03-13 (×3): qty 2

## 2022-03-13 NOTE — Lactation Note (Signed)
This note was copied from a baby's chart. Lactation Consultation Note  Patient Name: Kimberly Poole IFOYD'X Date: 03/13/2022 Reason for consult: Initial assessment;Infant < 6lbs;Late-preterm 34-36.6wks Age:36 hours Used Interpreter #[id number] Casimiro Needle for consult. LC asked mom if she was going to BF and formula feed. Mom stated she wasn't going to BF right now just formula feed. Asked mom if she would like to use DEBP since baby was small. Mom stated no she didn't wan to pump. Mom isn't putting baby to the breast at this time.  Mention to mom her mature milk will come in in 3-4 days. Encouraged mom to call for assistance if she changes her mind about BF.  Maternal Data Has patient been taught Hand Expression?: No Does the patient have breastfeeding experience prior to this delivery?: Yes How long did the patient breastfeed?: it was 14 yrs ago  Feeding Nipple Type: Nfant Slow Flow (purple)  LATCH Score                    Lactation Tools Discussed/Used    Interventions Interventions: [facility] brochure  Discharge    Consult Status Consult Status: Complete    Meena Barrantes G 03/13/2022, 2:41 AM

## 2022-03-13 NOTE — Progress Notes (Signed)
POSTPARTUM PROGRESS NOTE  POD #1  Subjective:  Kimberly Poole is a 36 y.o. [phone] s/p repeat LTCS and BTL at [redacted]w[redacted]d.  She reports she doing well. No acute events overnight. She still has catheter in place, has not yet been out of bed. Denies nausea or vomiting. She has not passed flatus. Pain is moderately controlled.  Lochia is normal.  Objective: Blood pressure 113/81, pulse (!) 55, temperature 98 F (36.7 C), temperature source Oral, resp. rate 20, height 5\' 1"  (1.549 m), weight 77.2 kg, last menstrual period 06/18/2021, SpO2 100 %, unknown if currently breastfeeding.  Physical Exam:  General: alert, cooperative and no distress Chest: no respiratory distress Heart:regular rate, distal pulses intact Abdomen: soft, nontender,  Uterine Fundus: firm, appropriately tender DVT Evaluation: No calf swelling or tenderness Extremities: no LE edema Skin: warm, dry; incision clean/dry/intact w/ pressure dressing in place  Recent Labs    03/12/22 1221 03/13/22 0632  HGB 10.7* 8.8*  HCT 33.9* 26.5*    Assessment/Plan: Kimberly Poole is a 36 y.o. [phone] s/p RCS + BTL at [redacted]w[redacted]d for painful contractions.  POD#1 - Doing welll; pain controlled.  Routine postpartum care  OOB, ambulate  Lovenox for VTE prophylaxis Anemia: asymptomatic, will give IV venofer for hgb<9  Elevated Cr: unclear etiology, did have some mildy elevated BP's intermittently but has been normotensive for past day. Cr has started to downtrend, plan to repeat tomorrow morning. LFT's were also mildly elevated but not meeting criteria. No BP meds at this time, will continue to monitor and set up one week BP check in clinic.   Contraception: s/p BTL Feeding: bottle  Depression: TOC consult placed  Dispo: Plan for discharge POD#2-3 pending patient progress.   LOS: 1 day   [redacted]w[redacted]d, MD/MPH Attending Family Medicine Physician, [facility] for [facility],  [facility]  03/13/2022, 8:40 AM

## 2022-03-13 NOTE — Progress Notes (Signed)
CSW consulted for "Drug use and OD during pregnancy", CSW unable to find any documentation to reflect consult reason in MOB's chart. MOB's attending provider and infant's attending provider unaware of any substance use during pregnancy or overdose. CSW did not address as there is no documentation in chart to reflect this consult reason.   CSW received consult for hx of Anxiety and Depression.  CSW met with MOB to offer support and complete assessment. CSW utilized [employer] language service spanish video interpreter Asencion Partridge [phone]). MOB was welcoming and granted CSW verbal permission to speak in front of guests about anything. CSW and MOB discussed MOB's mental health history. MOB reported that she was diagnosed with anxiety and depression 9 years ago. MOB reported that she is currently taking hydroxyzine and citalopram which is helpful. MOB reported that she is seeing a psychiatrist "Madison" through her OB provider and denied needing any additional therapy resources. CSW inquired about how MOB was feeling emotionally since giving birth, MOB reported that she was feeling good. MOB presented calm and did not demonstrate any acute mental health signs/symptoms. CSW assessed for safety, MOB denied SI and HI. CSW did not assess for domestic violence as FOB was present. CSW inquired about MOB's support system, MOB reported that her brother is a support. CSW asked if MOB had all items needed to care for infant. MOB shared that she has a crib and FOB reported that he will get a car seat before infant is ready to discharge. MOB shared that infant arrived early and she still has to get a few things. CSW asked would a baby bundle be helpful, MOB reported yes. CSW agreed to provide. MOB thanked CSW and denied any additional needs.   CSW provided education regarding the baby blues period vs. perinatal mood disorders, discussed treatment and gave resources for mental health follow up if concerns arise.  CSW recommends  self-evaluation during the postpartum time period using the New Mom Checklist from Postpartum Progress and encouraged MOB to contact a medical professional if symptoms are noted at any time.    CSW identifies no further need for intervention and no barriers to discharge at this time.  CSW provided requested baby bundle.   Abundio Miu, [place] Worker [facility] Cell#: [phone]

## 2022-03-14 LAB — HEPATIC FUNCTION PANEL
ALT: 28 U/L (ref 0–44)
AST: 31 U/L (ref 15–41)
Albumin: 2 g/dL — ABNORMAL LOW (ref 3.5–5.0)
Alkaline Phosphatase: 117 U/L (ref 38–126)
Bilirubin, Direct: <0.1 mg/dL (ref 0.0–0.2)
Total Bilirubin: 0.3 mg/dL (ref 0.3–1.2)
Total Protein: 5.6 g/dL — ABNORMAL LOW (ref 6.5–8.1)

## 2022-03-14 LAB — CREATININE, SERUM
Creatinine, Ser: 1.15 mg/dL — ABNORMAL HIGH (ref 0.44–1.00)
GFR, Estimated: >60 mL/min (ref >60)

## 2022-03-14 LAB — RPR: RPR Ser Ql: NONREACTIVE

## 2022-03-14 MED ORDER — FERROUS SULFATE 325 (65 FE) MG PO TABS
325.0000 mg | ORAL_TABLET | ORAL | Status: DC
  Administered 2022-03-14: 325 mg via ORAL
  Filled 2022-03-14: qty 1

## 2022-03-14 MED ORDER — OXYCODONE HCL 5 MG PO TABS: 5.0000 mg | ORAL_TABLET | ORAL | 0 refills | Status: AC | PRN

## 2022-03-14 MED ORDER — ACETAMINOPHEN 500 MG PO TABS: 1000.0000 mg | ORAL_TABLET | Freq: Four times a day (QID) | ORAL | 0 refills | Status: AC

## 2022-03-14 MED ORDER — CITALOPRAM HYDROBROMIDE 40 MG PO TABS: 40.0000 mg | ORAL_TABLET | Freq: Every day | ORAL | 1 refills | Status: DC

## 2022-03-14 MED ORDER — SENNOSIDES-DOCUSATE SODIUM 8.6-50 MG PO TABS: 2.0000 | ORAL_TABLET | Freq: Every day | ORAL | 0 refills | Status: DC

## 2022-03-14 MED ORDER — GABAPENTIN 100 MG PO CAPS: 200.0000 mg | ORAL_CAPSULE | Freq: Two times a day (BID) | ORAL | 0 refills | Status: DC

## 2022-03-14 MED ORDER — KETOROLAC TROMETHAMINE 30 MG/ML IJ SOLN
30.0000 mg | Freq: Once | INTRAMUSCULAR | Status: AC
  Administered 2022-03-14: 30 mg via INTRAMUSCULAR
  Filled 2022-03-14: qty 1

## 2022-03-14 MED ORDER — KETOROLAC TROMETHAMINE 30 MG/ML IJ SOLN: 30.0000 mg | Freq: Once | INTRAMUSCULAR | Status: DC

## 2022-03-14 NOTE — Social Work (Signed)
CSW received and acknowledges consult for EDPS of 16. CSW has met with MOB and addressed Mental Health concerns in initial assessment and provided resources. No barriers to discharge at this time.  Letta Kocher, [place] Social Worker [phone]

## 2022-03-15 ENCOUNTER — Telehealth: Payer: Self-pay | Admitting: Family Medicine

## 2022-03-15 DIAGNOSIS — D62 Acute posthemorrhagic anemia: Secondary | ICD-10-CM | POA: Insufficient documentation

## 2022-03-15 LAB — SURGICAL PATHOLOGY

## 2022-03-15 MED ORDER — TETANUS-DIPHTH-ACELL PERTUSSIS 5-2.5-18.5 LF-MCG/0.5 IM SUSY
0.5000 mL | PREFILLED_SYRINGE | Freq: Once | INTRAMUSCULAR | Status: AC
  Administered 2022-03-15: 0.5 mL via INTRAMUSCULAR
  Filled 2022-03-15: qty 0.5

## 2022-03-15 MED ORDER — FERROUS SULFATE 325 (65 FE) MG PO TABS: 325.0000 mg | ORAL_TABLET | ORAL | 0 refills | Status: DC

## 2022-03-15 NOTE — Discharge Instructions (Signed)
1. Retire el vendaje alveolar 5 das despus de la ciruga como se especifica.  2. Seguimiento ambulatorio en 1 semana para un control de la incisin y en 4 a 6 semanas, segn lo programado para su visita posparto.  3. Tome Tylenol 1000 mg e ibuprofeno 600 mg varias veces al [employer] prximos das para Engineer, materials. Tambin puede tomar oxicodona segn sea necesario para el dolor irruptivo.  4. Tome hierro por va oral con un poco de vitamina C o alguna fruta para ayudar a Actuary.  5. Llame si comienza a experimentar un aumento del sangrado vaginal que requiere un cambio de toallas sanitarias cada 2 horas, dolor de cabeza intenso y repentino, dolor abdominal derecho, destellos de luz en una visin o cualquier preocupacin por una nueva presin arterial elevada.  English version Take off the honeycomb dressing 5 days after surgery as specified.  2.  Follow-up outpatient in 1 week for an incision check and in 4 to 6 weeks, as scheduled for your postpartum visit.   3.  Take Tylenol 1000 mg and ibuprofen 600 mg scheduled times a day for the next few days, to help with your pain.  You can also take oxycodone as needed for breakthrough pain.  4. Take oral iron with some vitamin C or some fruit to help boost up your blood levels.  5.  Please call if you start to experience increased vaginal bleeding requiring a change of pads every 2 hours, sudden onset severe headache, right abdominal pain, flashes of light in a vision or any concerns for new elevated blood pressure.

## 2022-03-15 NOTE — Telephone Encounter (Signed)
I called and tried to inform patient about upcoming appt but was unable to reach or leave a voicemail

## 2022-03-15 NOTE — Discharge Summary (Signed)
Postpartum Discharge Summary    Patient Name: Kimberly Poole DOB: 10/26/1985 MRN: [id number]  Date of admission: 03/12/2022 Delivery date:03/12/2022  Delivering provider: Aletha Halim  Date of discharge: 03/15/2022  Admitting diagnosis: Pregnancy at 35/6 Secondary diagnosis:  Painful contractions, ?early labor and a history of a prior cesarean section Patient Active Problem List   Diagnosis Date Noted   Acute blood loss anemia 03/15/2022   History of bilateral tubal ligation 03/13/2022   Status post cesarean delivery 03/12/2022   Preterm uterine contractions, antepartum 02/12/2022   Previous cesarean delivery affecting pregnancy 11/22/2021   Nuchal fold thickening on prenatal ultrasound 11/22/2021   Supervision of high risk pregnancy, antepartum 09/29/2021   AMA (advanced maternal age) multigravida 35+ 09/29/2021   History of drug overdose 09/29/2021   History of drug use 09/29/2021   Migraines    Depression    Language barrier, cultural differences 09/16/2021   Additional problems: n/a    Discharge diagnosis: Preterm Pregnancy Delivered                                              Post partum procedures: none Augmentation: N/A Complications: uterine window at c-section  Hospital course: Onset of Labor With Unplanned C/S   36 y.o. yo G2P1102 at [facility]w6d[facility]. She had painful uterine contractions unresponsive to IVF bolus, procardia and terbutaline. Cervix stable at 1/90/+1 and q2-360montractions. Given how uncomfortable patient was and her desire to not TOLAC she underwent a rpt c-section and BTL with a uterine window noted. Delivery details as follows: Membrane Rupture Time/Date: 4:01 PM ,03/12/2022   Delivery Method:C-Section, Low Transverse  Details of operation can be found in separate operative note. Patient had a postpartum course complicated by acute blood loss anemia. She had 1 dose of IV venofer and will be  continuing with PO iron supplementation. Also noted to have elevated LFTs in the hospital, which resolved completely prior to discharge. She is ambulating,tolerating a regular diet, passing flatus, and urinating well.  Patient is discharged home in stable condition 03/15/22.  Newborn Data: Birth date:03/12/2022  Birth time:4:02 PM  Gender:Female  Living status:Living  Apgars:8 ,9  Weight:2500 g   Magnesium Sulfate received: No BMZ received: No Rhophylac:N/A MMR:N/A T-DaP: none documented Flu: Yes Transfusion:No  Physical exam  Vitals:   03/14/22 0530 03/14/22 1539 03/14/22 1936 03/15/22 0517  BP: 112/82 110/73 111/79 120/80  Pulse: (!) 53 69 61 71  Resp: _0 Temp: 97.6 F (36.4 C) 98.1 F (36.7 C) 98 F (36.7 C) 98.1 F (36.7 C)  TempSrc: Oral Oral Oral Oral  SpO2: 100% 99%    Weight:      Height:       General: alert, cooperative, and no distress Lochia: appropriate Uterine Fundus: firm Incision: Healing well with no significant drainage, No significant erythema, Dressing is clean, dry, and intact DVT Evaluation: Calf/Ankle edema is present Labs: Lab Results  Component Value Date   WBC 14.2 (H) 03/13/2022   HGB 8.8 (L) 03/13/2022   HCT 26.5 (L) 03/13/2022   MCV 77.9 (L) 03/13/2022   PLT 235 03/13/2022      Latest Ref Rng & Units 03/14/2022    4:44 AM  CMP  Creatinine 0.44 - 1.00 mg/dL 1.15   Total Protein 6.5 - 8.1 g/dL 5.6  Total Bilirubin 0.3 - 1.2 mg/dL 0.3   Alkaline Phos 38 - 126 U/L 117   AST 15 - 41 U/L 31   ALT 0 - 44 U/L 28    Edinburgh Score:    03/13/2022    9:51 PM  Edinburgh Postnatal Depression Scale Screening Tool  I have been able to laugh and see the funny side of things. 2  I have looked forward with enjoyment to things. 2  I have blamed myself unnecessarily when things went wrong. 2  I have been anxious or worried for no good reason. 3  I have felt scared or panicky for no good reason. 2  Things have been getting on  top of me. 2  I have been so unhappy that I have had difficulty sleeping. 1  I have felt sad or miserable. 2  I have been so unhappy that I have been crying. 2  The thought of harming myself has occurred to me. 0  Edinburgh Postnatal Depression Scale Total 18      After visit meds:  Allergies as of 03/15/2022       Reactions   Diclofenac Itching   Other Itching, Swelling   Nuts-causes itching tongue and swelling of mouth        Medication List     STOP taking these medications    acetaminophen 650 MG CR tablet Commonly known as: TYLENOL Replaced by: acetaminophen 500 MG tablet   NIFEdipine 10 MG capsule Commonly known as: PROCARDIA       TAKE these medications    acetaminophen 500 MG tablet Commonly known as: TYLENOL Take 2 tablets (1,000 mg total) by mouth every 6 (six) hours. Replaces: acetaminophen 650 MG CR tablet   citalopram 40 MG tablet Commonly known as: CELEXA Take 1 tablet (40 mg total) by mouth daily.   cyclobenzaprine 10 MG tablet Commonly known as: FLEXERIL Take 1 tablet (10 mg total) by mouth 2 (two) times daily as needed for muscle spasms.   ferrous sulfate 325 (65 FE) MG tablet Take 1 tablet (325 mg total) by mouth every other day. Start taking on: March 16, 2022   gabapentin 100 MG capsule Commonly known as: NEURONTIN Take 2 capsules (200 mg total) by mouth 2 (two) times daily.   hydrOXYzine 25 MG tablet Commonly known as: ATARAX Take 1 tablet (25 mg total) by mouth daily with breakfast.   oxyCODONE 5 MG immediate release tablet Commonly known as: Oxy IR/ROXICODONE Take 1-2 tablets (5-10 mg total) by mouth every 4 (four) hours as needed for up to 7 days for moderate pain.   pantoprazole 20 MG tablet Commonly known as: PROTONIX Take 1 tablet (20 mg total) by mouth 2 (two) times daily before a meal.   PrePLUS 27-1 MG Tabs Take 1 tablet by mouth daily.   senna-docusate 8.6-50 MG tablet Commonly known as: Senokot-S Take 2  tablets by mouth at bedtime.   VITAMIN C PO Take by mouth.        Discharge home in stable condition Infant Feeding: Bottle and Breast Infant Disposition:home with mother Discharge instruction: per After Visit Summary and Postpartum booklet. Activity: Advance as tolerated. Pelvic rest for 6 weeks.  Diet: routine diet Anticipated Birth Control: BTL done PP Postpartum Appointment:6 weeks Additional Postpartum F/U: Incision check 1 week Future Appointments: Future Appointments  Date Time Provider [place]  03/21/2022  1:15 PM Griffin Basil, MD [facility]  03/28/2022  8:15 AM Clarnce Flock, MD [facility]  04/04/2022  8:15 AM Clarnce Flock, MD [facility]  04/12/2022  8:55 AM Woodroe Mode, MD [facility]  04/12/2022 11:15 AM [facility] [place]   Follow up Visit: as scheduled above.  Liliane Channel MD MPH OB Fellow, [place] for [place] 03/15/2022

## 2022-03-16 ENCOUNTER — Ambulatory Visit: Payer: Self-pay

## 2022-03-16 ENCOUNTER — Telehealth (HOSPITAL_COMMUNITY): Payer: Self-pay | Admitting: *Deleted

## 2022-03-16 DIAGNOSIS — Z1331 Encounter for screening for depression: Secondary | ICD-10-CM

## 2022-03-16 NOTE — Telephone Encounter (Signed)
IBH referral placed for EPDS score of 18 in the hospital.

## 2022-03-21 ENCOUNTER — Ambulatory Visit: Payer: Self-pay

## 2022-03-21 ENCOUNTER — Encounter: Payer: Self-pay | Admitting: Obstetrics and Gynecology

## 2022-03-21 ENCOUNTER — Telehealth (HOSPITAL_COMMUNITY): Payer: Self-pay | Admitting: *Deleted

## 2022-03-21 NOTE — Telephone Encounter (Signed)
Interpreter reports dialing number twice with pickup and hangup each time.  Odis Hollingshead, RN 03-21-2022 at 3:42pm

## 2022-03-23 ENCOUNTER — Inpatient Hospital Stay (HOSPITAL_COMMUNITY)
Admission: AD | Admit: 2022-03-23 | Discharge: 2022-03-23 | Disposition: A | Discharge status: Home / Self Care | Payer: Self-pay | Attending: Family Medicine | Admitting: Family Medicine

## 2022-03-23 ENCOUNTER — Other Ambulatory Visit: Payer: Self-pay

## 2022-03-23 ENCOUNTER — Ambulatory Visit (INDEPENDENT_AMBULATORY_CARE_PROVIDER_SITE_OTHER): Payer: Self-pay | Admitting: *Deleted

## 2022-03-23 VITALS — BP 115/77 | HR 77 | Ht 60.0 in | Wt 166.2 lb

## 2022-03-23 DIAGNOSIS — O9089 Other complications of the puerperium, not elsewhere classified: Secondary | ICD-10-CM | POA: Insufficient documentation

## 2022-03-23 DIAGNOSIS — Z1152 Encounter for screening for COVID-19: Secondary | ICD-10-CM | POA: Insufficient documentation

## 2022-03-23 DIAGNOSIS — Z659 Problem related to unspecified psychosocial circumstances: Secondary | ICD-10-CM

## 2022-03-23 DIAGNOSIS — Z609 Problem related to social environment, unspecified: Secondary | ICD-10-CM | POA: Insufficient documentation

## 2022-03-23 DIAGNOSIS — Z98891 History of uterine scar from previous surgery: Secondary | ICD-10-CM | POA: Insufficient documentation

## 2022-03-23 DIAGNOSIS — Z0130 Encounter for examination of blood pressure without abnormal findings: Secondary | ICD-10-CM

## 2022-03-23 DIAGNOSIS — Z4889 Encounter for other specified surgical aftercare: Secondary | ICD-10-CM

## 2022-03-23 DIAGNOSIS — G44209 Tension-type headache, unspecified, not intractable: Secondary | ICD-10-CM

## 2022-03-23 LAB — CBC
HCT: 33.8 % — ABNORMAL LOW (ref 36.0–46.0)
Hemoglobin: 10.5 g/dL — ABNORMAL LOW (ref 12.0–15.0)
MCH: 26.3 pg (ref 26.0–34.0)
MCHC: 31.1 g/dL (ref 30.0–36.0)
MCV: 84.5 fL (ref 80.0–100.0)
Platelets: 380 10*3/uL (ref 150–400)
RBC: 4 MIL/uL (ref 3.87–5.11)
RDW: 17.2 % — ABNORMAL HIGH (ref 11.5–15.5)
WBC: 5.7 10*3/uL (ref 4.0–10.5)
nRBC: 0 % (ref 0.0–0.2)

## 2022-03-23 LAB — COMPREHENSIVE METABOLIC PANEL
ALT: 30 U/L (ref 0–44)
AST: 31 U/L (ref 15–41)
Albumin: 3.1 g/dL — ABNORMAL LOW (ref 3.5–5.0)
Alkaline Phosphatase: 116 U/L (ref 38–126)
Anion gap: 12 (ref 5–15)
BUN: 7 mg/dL (ref 6–20)
CO2: 24 mmol/L (ref 22–32)
Calcium: 9.1 mg/dL (ref 8.9–10.3)
Chloride: 102 mmol/L (ref 98–111)
Creatinine, Ser: 0.87 mg/dL (ref 0.44–1.00)
GFR, Estimated: >60 mL/min (ref >60)
Glucose, Bld: 83 mg/dL (ref 70–99)
Potassium: 4.1 mmol/L (ref 3.5–5.1)
Sodium: 138 mmol/L (ref 135–145)
Total Bilirubin: 0.3 mg/dL (ref 0.3–1.2)
Total Protein: 7 g/dL (ref 6.5–8.1)

## 2022-03-23 LAB — RESP PANEL BY RT-PCR (RSV, FLU A&B, COVID)  RVPGX2
Influenza A by PCR: NEGATIVE
Influenza B by PCR: NEGATIVE
Resp Syncytial Virus by PCR: NEGATIVE
SARS Coronavirus 2 by RT PCR: NEGATIVE

## 2022-03-23 MED ORDER — KETOROLAC TROMETHAMINE 30 MG/ML IJ SOLN
30.0000 mg | Freq: Once | INTRAMUSCULAR | Status: AC
  Administered 2022-03-23: 30 mg via INTRAMUSCULAR
  Filled 2022-03-23: qty 1

## 2022-03-23 NOTE — Progress Notes (Signed)
Here for incision check s/p C/s. C/o having bad headaches at back of head , right side of face, right eye =8 for past few days and not releived by tylenol, has HA right now=8. Also c/o dizziness and blurry vision. Also c/o that 3 times since she went home she has had pain that starts in foot , goes up leg and makes her body have uncontrollable jerking, then she feels cold. BP 115/77. Incision intact with steristrips. Steristrips removed. Wound CDI. Reviewed wound care with her.  Discussed patient assessment and complaints with Dr. Currie Paris. Advised patient to go to MAU asap for further evaluation for very concerning symptoms that could be serious. RN called MAU charge RN (Jolynn)and MAU provider Lattie Haw [place])to give report. Staci Acosta

## 2022-03-23 NOTE — MAU Note (Signed)
Kimberly Poole Kimberly Poole is a 37 y.o. at Unknown here in MAU reporting: pt is post partum. (C/s12/24)Had rtn wound check.  Sent in for further eval.  C/o Sharp pain in outside of legs  when she went to get out of the bed. was unable to put weight on them.  Was feeling cold, and had shakes even though the rm was warm.  This happened 3 times,the last was more than 48 hrs ago. Has had several days with HA (Tylenol not helping), lot of pain in her neck.  Pain in rt eye and rt side of face- started yesterday. Ears feel blocked and she feels confused and dizzy.  Some incisional pain- worse in the morning and with movement.   Onset of complaint: first day she came home Pain score: HA-8, rt eye only Vitals:   03/23/22 1727  BP: 118/86  Pulse: 67  Resp: 17  Temp: 98.3 F (36.8 C)  SpO2: 100%     Lab orders placed from triage:   Provider called to triage, to discuss plan. While interpretor is here.

## 2022-03-23 NOTE — MAU Provider Note (Signed)
History     CSN: [id number]  Arrival date and time: 03/23/22 0865   Event Date/Time   First Provider Initiated Contact with Patient 03/23/22 1732      Chief Complaint  Patient presents with   Headache   Facial Pain   HPI  Kimberly Poole is a 37 y.o. [id number] postpartum from a repeat c/s on 12/24 who presents for evaluation of multiple complaints. Patient reports she has a headache. Patient rates the pain as a 7/10 and has tried tylenol and oxycodone for the pain with no relief. She reports it makes her eyes hurt and her ears feel full. She reports sometimes she feels cold. She reports sometimes she gets a sensation in her foot that makes her leg jerk.   She states she eats one meal a day and does not drink water. She reports she feel very sleep deprived because she is feeding the baby every 2 hours. She reports she has no help at home.   OB History     Gravida  2   Para  2   Term  1   Preterm  1   AB      Living  2      SAB      IAB      Ectopic      Multiple  0   Live Births  2           Past Medical History:  Diagnosis Date   Anxiety    Depression    Gallstones    Hypotension    Kidney stones    Migraines     Past Surgical History:  Procedure Laterality Date   CESAREAN SECTION     CESAREAN SECTION WITH BILATERAL TUBAL LIGATION N/A 03/12/2022   Procedure: CESAREAN SECTION WITH BILATERAL TUBAL LIGATION;  Surgeon: Aletha Halim, MD;  Location: [facility] LD [facility];  Service: Obstetrics;  Laterality: N/A;    Family History  Problem Relation Age of Onset   Lung disease Mother    Lung disease Father     Social History   Tobacco Use   Smoking status: Former    Types: Cigarettes    Quit date: 2020    Years since quitting: 4.0   Smokeless tobacco: Never  Vaping Use   Vaping Use: Never used  Substance Use Topics   Alcohol use: Not Currently    Comment: celebrations, stopped drinking alcohol 2022   Drug use: Not Currently     Types: Cocaine    Comment: last used 03/20/2017    Allergies:  Allergies  Allergen Reactions   Diclofenac Itching   Other Itching and Swelling    Nuts-causes itching tongue and swelling of mouth    No medications prior to admission.    Review of Systems  Constitutional: Negative.  Negative for fatigue and fever.  HENT: Negative.    Respiratory: Negative.  Negative for shortness of breath.   Cardiovascular: Negative.  Negative for chest pain.  Gastrointestinal: Negative.  Negative for abdominal pain, constipation, diarrhea, nausea and vomiting.  Genitourinary: Negative.  Negative for dysuria, vaginal bleeding and vaginal discharge.  Neurological:  Positive for headaches. Negative for dizziness.   Physical Exam   Blood pressure 128/85, pulse 84, temperature 98.3 F (36.8 C), temperature source Oral, resp. rate 18, height 5' (1.524 m), weight 75.3 kg, SpO2 100 %, currently breastfeeding.  Patient Vitals for the past 24 hrs:  BP Temp Temp src Pulse Resp SpO2 Height Weight  03/23/22 2019 128/85 -- -- 84 18 100 % -- --  03/23/22 1727 118/86 98.3 F (36.8 C) Oral 67 17 100 % 5' (1.524 m) 75.3 kg    Physical Exam Vitals and nursing note reviewed.  Constitutional:      General: She is not in acute distress.    Appearance: She is well-developed.  HENT:     Head: Normocephalic.  Eyes:     Pupils: Pupils are equal, round, and reactive to light.  Cardiovascular:     Rate and Rhythm: Normal rate and regular rhythm.     Heart sounds: Normal heart sounds.  Pulmonary:     Effort: Pulmonary effort is normal. No respiratory distress.     Breath sounds: Normal breath sounds.  Abdominal:     General: Bowel sounds are normal. There is no distension.     Palpations: Abdomen is soft.     Tenderness: There is no abdominal tenderness.  Skin:    General: Skin is warm and dry.  Neurological:     Mental Status: She is alert and oriented to person, place, and time.  Psychiatric:         Mood and Affect: Mood normal.        Behavior: Behavior normal.        Thought Content: Thought content normal.        Judgment: Judgment normal.    MAU Course  Procedures  Results for orders placed or performed during the hospital encounter of 03/23/22 (from the past 24 hour(s))  CBC     Status: Abnormal   Collection Time: 03/23/22  6:01 PM  Result Value Ref Range   WBC 5.7 4.0 - 10.5 K/uL   RBC 4.00 3.87 - 5.11 MIL/uL   Hemoglobin 10.5 (L) 12.0 - 15.0 g/dL   HCT 40.8 (L) 14.4 - 81.8 %   MCV 84.5 80.0 - 100.0 fL   MCH 26.3 26.0 - 34.0 pg   MCHC 31.1 30.0 - 36.0 g/dL   RDW 56.3 (H) 14.9 - 70.2 %   Platelets 380 150 - 400 K/uL   nRBC 0.0 0.0 - 0.2 %  Comprehensive metabolic panel     Status: Abnormal   Collection Time: 03/23/22  6:01 PM  Result Value Ref Range   Sodium 138 135 - 145 mmol/L   Potassium 4.1 3.5 - 5.1 mmol/L   Chloride 102 98 - 111 mmol/L   CO2 24 22 - 32 mmol/L   Glucose, Bld 83 70 - 99 mg/dL   BUN 7 6 - 20 mg/dL   Creatinine, Ser 6.37 0.44 - 1.00 mg/dL   Calcium 9.1 8.9 - 85.8 mg/dL   Total Protein 7.0 6.5 - 8.1 g/dL   Albumin 3.1 (L) 3.5 - 5.0 g/dL   AST 31 15 - 41 U/L   ALT 30 0 - 44 U/L   Alkaline Phosphatase 116 38 - 126 U/L   Total Bilirubin 0.3 0.3 - 1.2 mg/dL   GFR, Estimated >85 >02 mL/min   Anion gap 12 5 - 15  Resp panel by RT-PCR (RSV, Flu A&B, Covid) Anterior Nasal Swab     Status: None   Collection Time: 03/23/22  6:22 PM   Specimen: Anterior Nasal Swab  Result Value Ref Range   SARS Coronavirus 2 by RT PCR NEGATIVE NEGATIVE   Influenza A by PCR NEGATIVE NEGATIVE   Influenza B by PCR NEGATIVE NEGATIVE   Resp Syncytial Virus by PCR NEGATIVE NEGATIVE  MDM Labs ordered and reviewed.   Resp panel CBC, CMP Toradol IM- patient reports some improvement   Food tray given- patient ate small amount of food. CNM discussed with patient if she is restricting for weight loss or if she is struggling with nausea- patient denies  CNM had  lengthy discussion with patient about proper postpartum care including nutrition and hydration. Discussed sleep hygiene and altering sleep schedule with newborn. Discussed resources in community for support and [place] with [facility].  Patient not having any other symptoms reported to RN or office staff while in MAU  Assessment and Plan   1. Postpartum state   2. Acute non intractable tension-type headache   3. Poor social situation     -Discharge home in stable condition -Postpartum precautions discussed -Patient advised to follow-up with [facility] for [facility] resources and postpartum care -Patient may return to MAU as needed or if her condition were to change or worsen  Wende Mott, CNM 03/23/2022, 5:32 PM

## 2022-03-28 ENCOUNTER — Encounter: Payer: Self-pay | Admitting: Family Medicine

## 2022-03-30 ENCOUNTER — Ambulatory Visit: Payer: Self-pay | Admitting: Clinical

## 2022-03-30 DIAGNOSIS — Z91199 Patient's noncompliance with other medical treatment and regimen due to unspecified reason: Secondary | ICD-10-CM

## 2022-03-30 NOTE — BH Specialist Note (Signed)
Pt did not arrive to video visit and did not answer the phone; Left HIPPA-compliant message to call back [place] from [facility] at  [phone] (Jamie's office).  

## 2022-04-03 ENCOUNTER — Inpatient Hospital Stay (HOSPITAL_COMMUNITY): Admit: 2022-04-03 | Payer: Self-pay | Admitting: Family Medicine

## 2022-04-03 DIAGNOSIS — O0990 Supervision of high risk pregnancy, unspecified, unspecified trimester: Secondary | ICD-10-CM

## 2022-04-03 DIAGNOSIS — O34219 Maternal care for unspecified type scar from previous cesarean delivery: Secondary | ICD-10-CM

## 2022-04-04 ENCOUNTER — Encounter: Payer: Self-pay | Admitting: Family Medicine

## 2022-04-12 ENCOUNTER — Other Ambulatory Visit: Payer: Self-pay

## 2022-04-12 ENCOUNTER — Encounter: Payer: Self-pay | Admitting: Obstetrics & Gynecology

## 2022-04-19 ENCOUNTER — Other Ambulatory Visit: Payer: Self-pay

## 2022-04-19 ENCOUNTER — Ambulatory Visit (INDEPENDENT_AMBULATORY_CARE_PROVIDER_SITE_OTHER): Payer: Self-pay | Admitting: Obstetrics and Gynecology

## 2022-04-19 ENCOUNTER — Encounter: Payer: Self-pay | Admitting: Obstetrics and Gynecology

## 2022-04-19 ENCOUNTER — Other Ambulatory Visit (HOSPITAL_COMMUNITY)
Admission: RE | Admit: 2022-04-19 | Discharge: 2022-04-19 | Disposition: A | Discharge status: Home / Self Care | Payer: Self-pay | Source: Ambulatory Visit | Attending: Obstetrics and Gynecology | Admitting: Obstetrics and Gynecology

## 2022-04-19 NOTE — Progress Notes (Signed)
Post Partum Visit Note  Kimberly Poole is a 37 y.o. [phone] female who presents for a postpartum visit. She is 5 weeks and 3 days postpartum following a repeat cesarean section.  I have fully reviewed the prenatal and intrapartum course. The delivery was at 35 gestational weeks and 6 days.  Anesthesia: epidural. Postpartum course has been good. Baby is doing well. Baby is feeding by both breast and bottle - neosure . Bleeding brown. Bowel function is normal. Bladder function is normal. Patient is not sexually active. Contraception method is tubal. Postpartum depression screening: positive.   The pregnancy intention screening data noted above was reviewed. Potential methods of contraception were discussed. The patient elected to proceed with No data recorded.   Edinburgh Postnatal Depression Scale - 04/19/22 1331       Edinburgh Postnatal Depression Scale:  In the Past 7 Days   I have been able to laugh and see the funny side of things. 0    I have looked forward with enjoyment to things. 0    I have blamed myself unnecessarily when things went wrong. 2    I have been anxious or worried for no good reason. 2    I have felt scared or panicky for no good reason. 2    Things have been getting on top of me. 2    I have been so unhappy that I have had difficulty sleeping. 2   because of baby   I have felt sad or miserable. 0    I have been so unhappy that I have been crying. 1    The thought of harming myself has occurred to me. 0    Edinburgh Postnatal Depression Scale Total 11             Health Maintenance Due  Topic Date Due   COVID-19 Vaccine (1) Never done   PAP SMEAR-Modifier  Never done   INFLUENZA VACCINE  Never done    The following portions of the patient's history were reviewed and updated as appropriate: allergies, current medications, past family history, past medical history, past social history, past surgical history, and problem list.  Review  of Systems Pertinent items are noted in HPI.  Objective:  BP 119/82   Pulse 92   Wt 166 lb 6.4 oz (75.5 kg)   Breastfeeding Yes   BMI 32.50 kg/m    General:  alert, cooperative, and no distress   Breasts:  not indicated  Lungs: clear to auscultation bilaterally  Heart:  regular rate and rhythm  Abdomen: soft, non-tender; bowel sounds normal; no masses,  no organomegaly   Wound well approximated incision, clean dry and intact  GU exam:  normal       Assessment:    Encounter for postpartum exam  normal postpartum exam.   Plan:   Essential components of care per ACOG recommendations:  1.  Mood and well being: Patient with positive depression screening today. Reviewed local resources for support.  - Patient tobacco use? No.   - hx of drug use? No.    2. Infant care and feeding:  -Patient currently breastmilk feeding? Yes. Reviewed importance of draining breast regularly to support lactation.  -Social determinants of health (SDOH) reviewed in EPIC. The following needs were identified: depression  3. Sexuality, contraception and birth spacing - Patient does not want a pregnancy in the next year.  Desired family size is 2 children.  - Reviewed reproductive life planning. Reviewed contraceptive  methods based on pt preferences and effectiveness.  Patient desired Female Sterilization today.   - Discussed birth spacing of 18 months  4. Sleep and fatigue -Encouraged family/partner/community support of 4 hrs of uninterrupted sleep to help with mood and fatigue  5. Physical Recovery  - Discussed patients delivery and complications. She describes her labor as good. - Patient had a C-section repeat; no problems after deliver.  Patient expressed understanding - Patient has urinary incontinence? No. - Patient is safe to resume physical and sexual activity  6.  Health Maintenance - HM due items addressed Yes - Last pap smear No results found for: "DIAGPAP" Pap smear done at today's  visit.  -Breast Cancer screening indicated? No.   7. Chronic Disease/Pregnancy Condition follow up: None  - PCP follow up  Follow up in 1 year  Griffin Basil, [place] for [employer], [place]

## 2022-04-25 LAB — CYTOLOGY - PAP
Adequacy: ABSENT
Comment: NEGATIVE
Diagnosis: NEGATIVE
High risk HPV: NEGATIVE

## 2022-05-09 ENCOUNTER — Encounter (HOSPITAL_COMMUNITY): Payer: Self-pay | Admitting: Obstetrics and Gynecology

## 2022-05-18 ENCOUNTER — Other Ambulatory Visit: Payer: Self-pay | Admitting: General Practice

## 2022-05-18 DIAGNOSIS — F32A Depression, unspecified: Secondary | ICD-10-CM

## 2022-05-18 MED ORDER — CITALOPRAM HYDROBROMIDE 40 MG PO TABS: 40.0000 mg | ORAL_TABLET | Freq: Every day | ORAL | 2 refills | Status: DC

## 2022-07-07 ENCOUNTER — Encounter (HOSPITAL_COMMUNITY): Payer: Self-pay | Admitting: Obstetrics and Gynecology

## 2022-08-10 ENCOUNTER — Other Ambulatory Visit: Payer: Self-pay | Admitting: Family Medicine

## 2022-08-10 DIAGNOSIS — F3289 Other specified depressive episodes: Secondary | ICD-10-CM

## 2022-08-10 DIAGNOSIS — F32A Depression, unspecified: Secondary | ICD-10-CM

## 2022-09-08 ENCOUNTER — Telehealth: Payer: Self-pay | Admitting: Family Medicine

## 2022-09-08 DIAGNOSIS — F419 Anxiety disorder, unspecified: Secondary | ICD-10-CM

## 2022-09-08 DIAGNOSIS — F3289 Other specified depressive episodes: Secondary | ICD-10-CM

## 2022-09-08 DIAGNOSIS — F32A Depression, unspecified: Secondary | ICD-10-CM

## 2022-09-08 DIAGNOSIS — Z609 Problem related to social environment, unspecified: Secondary | ICD-10-CM

## 2022-09-08 MED ORDER — CITALOPRAM HYDROBROMIDE 40 MG PO TABS: 40.0000 mg | ORAL_TABLET | Freq: Every day | ORAL | 2 refills | Status: DC

## 2022-09-08 MED ORDER — HYDROXYZINE HCL 25 MG PO TABS: 25.0000 mg | ORAL_TABLET | Freq: Every day | ORAL | 2 refills | Status: DC

## 2022-09-08 NOTE — Telephone Encounter (Signed)
Per discussion with Dr. Crissie Reese refills x 3 months approved and then will need to follow up with PCP for future refills. I called Byrd Hesselbach with Interpreter [facility] and informed her Doctor approved 3 refills but then no more refills from [place], she will need to see a PCP. She said she does not have a pcp. I asked if she needs a referral and she said yes. I informed her I will send in a referral to [facility] and wellness and if she does not hear from them within 2 weeks to call us back. She voices understanding. Nancy Fetter

## 2022-09-08 NOTE — Telephone Encounter (Signed)
Pt. Is requesting a refill of ATARAX and CELEXA to be sent to [facility]

## 2022-11-10 ENCOUNTER — Other Ambulatory Visit: Payer: Self-pay | Admitting: Family Medicine

## 2022-11-10 DIAGNOSIS — F3289 Other specified depressive episodes: Secondary | ICD-10-CM

## 2022-11-10 DIAGNOSIS — F32A Depression, unspecified: Secondary | ICD-10-CM

## 2022-12-12 ENCOUNTER — Telehealth: Payer: Self-pay | Admitting: Family Medicine

## 2022-12-12 DIAGNOSIS — F3289 Other specified depressive episodes: Secondary | ICD-10-CM

## 2022-12-12 DIAGNOSIS — F32A Depression, unspecified: Secondary | ICD-10-CM

## 2022-12-12 NOTE — Telephone Encounter (Signed)
Patient came into the office regarding her depression medication, Dr. Crissie Reese gave her a refill for 3 months. Referral was sent for primary care and patient never received a call back. Patient is now out of medications. I called the referred doctors office to get her schedule but has an appointment on 12/20/22 at 10:50 am. Patient need a refill at least to her PCP Appointment.

## 2022-12-14 MED ORDER — HYDROXYZINE HCL 25 MG PO TABS: 25.0000 mg | ORAL_TABLET | Freq: Every day | ORAL | 0 refills | Status: AC

## 2022-12-14 MED ORDER — CITALOPRAM HYDROBROMIDE 40 MG PO TABS: 40.0000 mg | ORAL_TABLET | Freq: Every day | ORAL | 0 refills | Status: DC

## 2022-12-14 NOTE — Telephone Encounter (Signed)
30 Kimberly Poole refills of requested medications approved by Dr. Crissie Reese and e-prescribed. Pt was informed.

## 2022-12-20 ENCOUNTER — Ambulatory Visit (INDEPENDENT_AMBULATORY_CARE_PROVIDER_SITE_OTHER): Payer: Self-pay | Admitting: Family Medicine

## 2022-12-20 ENCOUNTER — Other Ambulatory Visit (HOSPITAL_BASED_OUTPATIENT_CLINIC_OR_DEPARTMENT_OTHER): Payer: Self-pay

## 2022-12-20 ENCOUNTER — Encounter (HOSPITAL_BASED_OUTPATIENT_CLINIC_OR_DEPARTMENT_OTHER): Payer: Self-pay | Admitting: Family Medicine

## 2022-12-20 VITALS — BP 116/84 | HR 94 | Ht 60.0 in | Wt 170.9 lb

## 2022-12-20 DIAGNOSIS — F411 Generalized anxiety disorder: Secondary | ICD-10-CM | POA: Insufficient documentation

## 2022-12-20 DIAGNOSIS — F5101 Primary insomnia: Secondary | ICD-10-CM | POA: Insufficient documentation

## 2022-12-20 DIAGNOSIS — F331 Major depressive disorder, recurrent, moderate: Secondary | ICD-10-CM

## 2022-12-20 MED ORDER — TRAZODONE HCL 50 MG PO TABS
25.0000 mg | ORAL_TABLET | Freq: Every evening | ORAL | 3 refills | Status: DC | PRN
  Filled 2022-12-20: qty 30, 30d supply, fill #0

## 2022-12-20 MED ORDER — CITALOPRAM HYDROBROMIDE 20 MG PO TABS
20.0000 mg | ORAL_TABLET | Freq: Every day | ORAL | 3 refills | Status: DC
  Filled 2022-12-20: qty 30, 30d supply, fill #0

## 2022-12-20 NOTE — Progress Notes (Signed)
New Patient Office Visit  Subjective:   Kimberly Poole 1985-06-12 12/20/2022  Chief Complaint  Patient presents with   New Patient (Initial Visit)    Patient is here today to get established with the practice. Patient states that she has been having problems with depression.    HPI: Kimberly Poole presents today to establish care at [facility] at [facility]. Introduced to Publishing rights manager role and practice setting.  All questions answered.   Last MVH:QIONG Last annual physical: Unknown Concerns: See below   Interpreter: Gearlean Alf  Administrator) at bedside with [place]   DEPRESSION, ANXIETY: Kimberly Poole presents for the medical management of depression.  Current medication regimen: Hydroxyzine PRN and Celexa 40mg . She was placed on these by OBGYN prior to pregnancy and continued postpartum. She has run out of medications and experienced significant anxiety and depression with panic like symptoms. She does reports hx of anxiety with frequent panic attacks in the past. She states panic attacks occur when running out of medication. She does have her husband and her daughter here in the [place] for support. She does feel overwhelmed at time due to not having her family in the country.  She used the Hydroxyzine at night for insomnia but states she has no benefit or improvement with taking. She has trouble falling and staying asleep. She states in [place] she was on "several medications" but was taken off of them several years ago by [employer].   Counseling: Recommended. Would like a fluent Spanish Therapist  Well controlled: Not currently Denies SI/HI.      12/20/2022   11:02 AM 12/29/2021   11:17 AM 11/22/2021   10:05 AM 11/17/2021    1:28 PM 09/29/2021   11:46 AM  Depression screen PHQ 2/9  Decreased Interest 1 1 1 1 3   Down, Depressed, Hopeless 0 1 1 1 3   PHQ - 2 Score 1 2 2 2 6   Altered sleeping 1 1 1 2  3   Tired, decreased energy 1 1 1 2 3   Change in appetite 1 1 1 2 3   Feeling bad or failure about yourself  1 1 1 1 3   Trouble concentrating 1 1 1 1  0  Moving slowly or fidgety/restless 1 2 1 1  0  Suicidal thoughts 0 1 1 0 0  PHQ-9 Score 7 10 9 11 18   Difficult doing work/chores Not difficult at all           12/20/2022   11:03 AM 12/29/2021   11:17 AM 12/28/2021   11:34 AM 11/22/2021   10:05 AM  GAD 7 : Generalized Anxiety Score  Nervous, Anxious, on Edge 1 2 2 2   Control/stop worrying 1 1 2 2   Worry too much - different things 1 2 2 2   Trouble relaxing 1 2 2 2   Restless 1 2 2 2   Easily annoyed or irritable 1 2 2 2   Afraid - awful might happen 1 2 2 2   Total GAD 7 Score 7 13 14 14   Anxiety Difficulty Somewhat difficult        The following portions of the patient's history were reviewed and updated as appropriate: past medical history, past surgical history, family history, social history, allergies, medications, and problem list.   Patient Active Problem List   Diagnosis Date Noted   Primary insomnia 12/20/2022   GAD (generalized anxiety disorder) 12/20/2022   Acute blood loss anemia 03/15/2022   History of bilateral tubal ligation  03/13/2022   Status post cesarean delivery 03/12/2022   Preterm uterine contractions, antepartum 02/12/2022   Previous cesarean delivery affecting pregnancy 11/22/2021   Nuchal fold thickening on prenatal ultrasound 11/22/2021   Supervision of high risk pregnancy, antepartum 09/29/2021   AMA (advanced maternal age) multigravida 35+ 09/29/2021   History of drug overdose 09/29/2021   History of drug use 09/29/2021   Migraines    Depression    Language barrier, cultural differences 09/16/2021   Past Medical History:  Diagnosis Date   Anxiety    Depression    Gallstones    Hypotension    Kidney stones    Migraines    Past Surgical History:  Procedure Laterality Date   CESAREAN SECTION     CESAREAN SECTION WITH BILATERAL TUBAL  LIGATION N/A 03/12/2022   Procedure: CESAREAN SECTION WITH BILATERAL TUBAL LIGATION;  Surgeon: [place] Bing, MD;  Location: [facility] LD [facility];  Service: Obstetrics;  Laterality: N/A;   Family History  Problem Relation Age of Onset   Lung disease Mother    Lung disease Father    Social History   Socioeconomic History   Marital status: Married    Spouse name: Not on file   Number of children: Not on file   Years of education: Not on file   Highest education level: Not on file  Occupational History   Not on file  Tobacco Use   Smoking status: Former    Current packs/day: 0.00    Types: Cigarettes    Quit date: 2020    Years since quitting: 4.7   Smokeless tobacco: Never  Vaping Use   Vaping status: Never Used  Substance and Sexual Activity   Alcohol use: Not Currently    Comment: celebrations, stopped drinking alcohol 2022   Drug use: Not Currently    Types: Cocaine    Comment: last used 03/20/2017   Sexual activity: Not Currently    Birth control/protection: None  Other Topics Concern   Not on file  Social History Narrative   ** Merged History Encounter **       Social Determinants of Health   Financial Resource Strain: Medium Risk (12/20/2022)   Overall Financial Resource Strain (CARDIA)    Difficulty of Paying Living Expenses: Somewhat hard  Food Insecurity: Food Insecurity Present (12/20/2022)   Hunger Vital Sign    Worried About Running Out of Food in the Last Year: Sometimes true    Ran Out of Food in the Last Year: Never true  Transportation Needs: No Transportation Needs (12/20/2022)   PRAPARE - Administrator, Civil Service (Medical): No    Lack of Transportation (Non-Medical): No  Physical Activity: Inactive (12/20/2022)   Exercise Vital Sign    Days of Exercise per Week: 0 days    Minutes of Exercise per Session: 0 min  Stress: No Stress Concern Present (12/20/2022)   [employer] of Occupational Health - Occupational Stress Questionnaire     Feeling of Stress : Only a little  Social Connections: Moderately Isolated (12/20/2022)   Social Connection and Isolation Panel [NHANES]    Frequency of Communication with Friends and Family: More than three times a week    Frequency of Social Gatherings with Friends and Family: Never    Attends Religious Services: Never    Database administrator or Organizations: No    Attends Banker Meetings: Never    Marital Status: Married  Catering manager Violence: Not At Risk (12/20/2022)  Humiliation, Afraid, Rape, and Kick questionnaire    Fear of Current or Ex-Partner: No    Emotionally Abused: No    Physically Abused: No    Sexually Abused: No   Outpatient Medications Prior to Visit  Medication Sig Dispense Refill   acetaminophen (TYLENOL) 500 MG tablet Take 2 tablets (1,000 mg total) by mouth every 6 (six) hours. 30 tablet 0   hydrOXYzine (ATARAX) 25 MG tablet Take 1 tablet (25 mg total) by mouth daily with breakfast. 30 tablet 0   citalopram (CELEXA) 40 MG tablet Take 1 tablet (40 mg total) by mouth daily. 30 tablet 0   Ascorbic Acid (VITAMIN C PO) Take by mouth.     cyclobenzaprine (FLEXERIL) 10 MG tablet Take 1 tablet (10 mg total) by mouth 2 (two) times daily as needed for muscle spasms. 20 tablet 1   ferrous sulfate 325 (65 FE) MG tablet Take 1 tablet (325 mg total) by mouth every other day. 15 tablet 0   gabapentin (NEURONTIN) 100 MG capsule Take 2 capsules (200 mg total) by mouth 2 (two) times daily. 30 capsule 0   oxyCODONE (OXY IR/ROXICODONE) 5 MG immediate release tablet Take 5 mg by mouth every 4 (four) hours as needed for severe pain.     pantoprazole (PROTONIX) 20 MG tablet Take 1 tablet (20 mg total) by mouth 2 (two) times daily before a meal. 60 tablet 5   Prenatal Vit-Fe Fumarate-FA (PREPLUS) 27-1 MG TABS Take 1 tablet by mouth daily. 30 tablet 13   senna-docusate (SENOKOT-S) 8.6-50 MG tablet Take 2 tablets by mouth at bedtime. 30 tablet 0   No  facility-administered medications prior to visit.   Allergies  Allergen Reactions   Diclofenac Itching   Other Itching and Swelling    Nuts-causes itching tongue and swelling of mouth    ROS: A complete ROS was performed with pertinent positives/negatives noted in the HPI. The remainder of the ROS are negative.   Objective:   Today's Vitals   12/20/22 1051  BP: 116/84  Pulse: 94  SpO2: 100%  Weight: 170 lb 14.4 oz (77.5 kg)  Height: 5' (1.524 m)    GENERAL: Well-appearing, in NAD. Well nourished.  SKIN: Pink, warm and dry. No rash, lesion, ulceration, or ecchymoses.  Head: Normocephalic. NECK: Trachea midline. Full ROM w/o pain or tenderness. No lymphadenopathy.   RESPIRATORY: Chest wall symmetrical. Respirations even and non-labored.  MSK: Muscle tone and strength appropriate for age.  EXTREMITIES: Without clubbing, cyanosis, or edema.  NEUROLOGIC: No motor or sensory deficits. Steady, even gait. C2-C12 intact.  PSYCH/MENTAL STATUS: Alert, oriented x 3. Cooperative, mildly anxious but appropriate mood and affect. Frequent fidgeting with legs.      Assessment & Plan:  1. GAD (generalized anxiety disorder) 2. Moderate episode of recurrent major depressive disorder (HCC) Discussed triggering of bands, common stressors that can exacerbate depression and anxiety.  Recommended CBT therapy with a fluent Spanish licensed therapist and several local fluent Spanish-speaking therapist given to patient and her AVS.  Discussed that she can reach out to any of these to discuss insurance, sliding scale and payment options and to schedule an appointment.  She verbalized understanding.  Discussed safety plan and support system and she verbalized understanding.  Will restart her Celexa 20 mg daily.  Discussed possible side effects and the risk of abrupt discontinuation of the medication and she verbalized understanding.  Will follow-up in 4 to 6 weeks or sooner if needed.  3. Primary  insomnia Patient  will trial trazodone 25 mg to 50 mg at night as needed for primary insomnia related to anxiety and depression.  We discussed the safe use of this medication and possible side effects and she verbalized understanding.  She will follow-up in 4 to 6 weeks or sooner if needed.   Patient to reach out to office if new, worrisome, or unresolved symptoms arise or if no improvement in patient's condition. Patient verbalized understanding and is agreeable to treatment plan. All questions answered to patient's satisfaction.    Return in about 5 weeks (around 01/24/2023) for Follow up Mood, AE.   Of note, portions of this note may have been created with voice recognition software Physicist, medical). While this note has been edited for accuracy, occasional wrong-word or 'sound-a-like' substitutions may have occurred due to the inherent limitations of voice recognition software.  Yolanda Manges, FNP

## 2022-12-20 NOTE — Patient Instructions (Addendum)
[facility] [place], [place] [address], [place] [address]  [phone]  Dr. Marikay Alar Pea Lic [facility] [place] [facility], PhD, Maryellen Pile de revolucin [address] [place], [place] [address]  [phone]  [facility] [address]  [place], [place] [address] [phone]  [address] [place], [place] [address] [phone]  [facility] Consejero clnico de salud mental [place], [facility] [address] [place], [place] [address]  [phone] [address] [place], [place] [address] [phone]  [facility] Licensed Clinical Mental Health Counselor, [facility] [address] [place], [place] [address]  [phone]

## 2023-01-24 ENCOUNTER — Other Ambulatory Visit (HOSPITAL_BASED_OUTPATIENT_CLINIC_OR_DEPARTMENT_OTHER): Payer: Self-pay

## 2023-02-01 ENCOUNTER — Encounter (HOSPITAL_BASED_OUTPATIENT_CLINIC_OR_DEPARTMENT_OTHER): Payer: Self-pay | Admitting: Family Medicine

## 2023-02-08 ENCOUNTER — Other Ambulatory Visit (HOSPITAL_BASED_OUTPATIENT_CLINIC_OR_DEPARTMENT_OTHER): Payer: Self-pay

## 2023-02-08 ENCOUNTER — Other Ambulatory Visit: Payer: Self-pay

## 2023-03-26 ENCOUNTER — Other Ambulatory Visit (HOSPITAL_BASED_OUTPATIENT_CLINIC_OR_DEPARTMENT_OTHER): Payer: Self-pay | Admitting: Family Medicine

## 2023-04-09 ENCOUNTER — Other Ambulatory Visit (HOSPITAL_BASED_OUTPATIENT_CLINIC_OR_DEPARTMENT_OTHER): Payer: Self-pay | Admitting: Family Medicine

## 2023-04-25 ENCOUNTER — Other Ambulatory Visit (HOSPITAL_BASED_OUTPATIENT_CLINIC_OR_DEPARTMENT_OTHER): Payer: Self-pay | Admitting: Family Medicine

## 2023-06-08 ENCOUNTER — Other Ambulatory Visit (HOSPITAL_BASED_OUTPATIENT_CLINIC_OR_DEPARTMENT_OTHER): Payer: Self-pay | Admitting: Family Medicine

## 2023-07-06 ENCOUNTER — Other Ambulatory Visit (HOSPITAL_BASED_OUTPATIENT_CLINIC_OR_DEPARTMENT_OTHER): Payer: Self-pay | Admitting: Family Medicine

## 2023-07-06 NOTE — Telephone Encounter (Signed)
 Copied from CRM [id number]. Topic: Clinical - Medication Refill >> Jul 06, 2023 12:35 PM Tiffany S wrote: Most Recent Primary Care Visit:  Provider: KNUTE THERSIA BITTERS  Department: [facility]  Visit Type: NEW PATIENT  Date: 12/20/2022  Medication: citalopram  (CELEXA ) 20 M  Has the patient contacted their pharmacy? Yes (Agent: If no, request that the patient contact the pharmacy for the refill. If patient does not wish to contact the pharmacy document the reason why and proceed with request.) (Agent: If yes, when and what did the pharmacy advise?)  Is this the correct pharmacy for this prescription? Yes If no, delete pharmacy and type the correct one.  This is the patient's preferred pharmacy:  [facility] - [place], [place] - [address] [place] [address] Phone: [phone] Fax: [phone]  [facility] [address] [place] [address] Phone: [phone] Fax: [phone]  [facility] [address] [place] [address] Phone: [phone] Fax: [phone]   Has the prescription been filled recently? Yes  Is the patient out of the medication? Yes  Has the patient been seen for an appointment in the last year OR does the patient have an upcoming appointment? Yes  Can we respond through MyChart? Yes  Agent: Please be advised that Rx refills may take up to 3 business days. We ask that you follow-up with your pharmacy.

## 2023-07-06 NOTE — Telephone Encounter (Unsigned)
 Copied from CRM [id number]. Topic: Clinical - Medication Refill >> Jul 06, 2023 12:35 PM Tiffany S wrote: Most Recent Primary Care Visit:  Provider: KNUTE THERSIA BITTERS  Department: [facility]  Visit Type: NEW PATIENT  Date: 12/20/2022  Medication: citalopram  (CELEXA ) 20 M  Has the patient contacted their pharmacy? Yes (Agent: If no, request that the patient contact the pharmacy for the refill. If patient does not wish to contact the pharmacy document the reason why and proceed with request.) (Agent: If yes, when and what did the pharmacy advise?)  Is this the correct pharmacy for this prescription? Yes If no, delete pharmacy and type the correct one.  This is the patient's preferred pharmacy:  [facility] - [place], [place] - [address] [place] [address] Phone: [phone] Fax: [phone]  [facility] [address] [place] [address] Phone: [phone] Fax: [phone]  [facility] [address] [place] [address] Phone: [phone] Fax: [phone]   Has the prescription been filled recently? Yes  Is the patient out of the medication? Yes  Has the patient been seen for an appointment in the last year OR does the patient have an upcoming appointment? Yes  Can we respond through MyChart? Yes  Agent: Please be advised that Rx refills may take up to 3 business days. We ask that you follow-up with your pharmacy.

## 2023-07-09 ENCOUNTER — Other Ambulatory Visit (HOSPITAL_BASED_OUTPATIENT_CLINIC_OR_DEPARTMENT_OTHER): Payer: Self-pay | Admitting: Family Medicine

## 2023-07-09 MED ORDER — CITALOPRAM HYDROBROMIDE 20 MG PO TABS: 20.0000 mg | ORAL_TABLET | Freq: Every day | ORAL | 3 refills | Status: AC

## 2023-07-09 NOTE — Telephone Encounter (Signed)
 Copied from CRM [id number]. Topic: Clinical - Medication Refill >> Jul 09, 2023  5:34 PM Phil Braun wrote: Most Recent Primary Care Visit:  Provider: Nonda Bays  Department: [facility]  Visit Type: NEW PATIENT  Date: 12/20/2022  Medication: traZODone  (DESYREL ) 50 MG tablet 90 days supply  Has the patient contacted their pharmacy? Yes (Agent: If no, request that the patient contact the pharmacy for the refill. If patient does not wish to contact the pharmacy document the reason why and proceed with request.) (Agent: If yes, when and what did the pharmacy advise?)  Is this the correct pharmacy for this prescription? Yes  This is the patient's preferred pharmacy:   [facility] - [place], [place] - [address] [place] [address] Phone: [phone] Fax: [phone]   Has the prescription been filled recently? Yes  Is the patient out of the medication? Yes  Has the patient been seen for an appointment in the last year OR does the patient have an upcoming appointment? Yes  Can we respond through MyChart? No  Agent: Please be advised that Rx refills may take up to 3 business days. We ask that you follow-up with your pharmacy.

## 2023-07-10 ENCOUNTER — Telehealth (HOSPITAL_BASED_OUTPATIENT_CLINIC_OR_DEPARTMENT_OTHER): Payer: Self-pay | Admitting: *Deleted

## 2023-07-10 MED ORDER — TRAZODONE HCL 50 MG PO TABS: 25.0000 mg | ORAL_TABLET | Freq: Every evening | ORAL | 1 refills | Status: AC | PRN

## 2023-07-10 NOTE — Telephone Encounter (Signed)
 Called [facility] and have changed pt's Celexa  to a 90-day supply.

## 2023-07-10 NOTE — Telephone Encounter (Signed)
 Copied from CRM [id number]. Topic: Clinical - Medical Advice >> Jul 10, 2023  1:00 PM Zipporah Him wrote: Reason for CRM: Patient is requesting a call back from nurse from the office, states she has a non emergent medical question for [employer].

## 2023-07-10 NOTE — Telephone Encounter (Signed)
 Not a medical oncology patient. Notified nurse, Levis Reams this was sent to the wrong pool

## 2023-07-10 NOTE — Telephone Encounter (Signed)
 Called pt and spoke with significant other. Pt was wanting to know if her celexa  had been able to be changed to a 90-day supply due to [place]. Let pt's significant other know that I was able to reach out to the pharmacy to have the medication changed to a 90-day supply and both verbalized understanding. Nothing further needed.

## 2023-07-10 NOTE — Telephone Encounter (Signed)
 Copied from CRM [id number]. Topic: Clinical - Prescription Issue >> Jul 09, 2023  5:36 PM Phil Braun wrote: Reason for CRM:   Ref:citalopram  (CELEXA ) 20 MG tablet  This refill was sent to the pharmacy but she is wanting a 90 day supply not the 30 day. She is going out [place] the [place] and needs the 90 days instead.

## 2023-07-10 NOTE — Telephone Encounter (Signed)
 Kimberly Poole, please advise if you are okay with pt's med being changed to a 90-day supply.
